# Patient Record
Sex: Male | Born: 1940 | Race: White | Hispanic: No | Smoking: Former smoker
Health system: Southern US, Community
[De-identification: ages and names within clinical notes are randomized; demographics above are authoritative.]

## PROBLEM LIST (undated history)

## (undated) DIAGNOSIS — I255 Ischemic cardiomyopathy: Secondary | ICD-10-CM

## (undated) DIAGNOSIS — A481 Legionnaires' disease: Secondary | ICD-10-CM

## (undated) DIAGNOSIS — I10 Essential (primary) hypertension: Secondary | ICD-10-CM

## (undated) DIAGNOSIS — I480 Paroxysmal atrial fibrillation: Secondary | ICD-10-CM

## (undated) DIAGNOSIS — I2510 Atherosclerotic heart disease of native coronary artery without angina pectoris: Secondary | ICD-10-CM

## (undated) DIAGNOSIS — M329 Systemic lupus erythematosus, unspecified: Secondary | ICD-10-CM

## (undated) DIAGNOSIS — E785 Hyperlipidemia, unspecified: Secondary | ICD-10-CM

## (undated) DIAGNOSIS — N200 Calculus of kidney: Secondary | ICD-10-CM

## (undated) DIAGNOSIS — I252 Old myocardial infarction: Secondary | ICD-10-CM

## (undated) DIAGNOSIS — R0602 Shortness of breath: Secondary | ICD-10-CM

## (undated) HISTORY — DX: Calculus of kidney: N20.0

## (undated) HISTORY — DX: Old myocardial infarction: I25.2

## (undated) HISTORY — DX: Systemic lupus erythematosus, unspecified: M32.9

## (undated) HISTORY — DX: Paroxysmal atrial fibrillation: I48.0

## (undated) HISTORY — DX: Atherosclerotic heart disease of native coronary artery without angina pectoris: I25.10

## (undated) HISTORY — PX: CATARACT EXTRACTION: SUR2

## (undated) HISTORY — DX: Legionnaires' disease: A48.1

## (undated) HISTORY — DX: Ischemic cardiomyopathy: I25.5

## (undated) HISTORY — DX: Hyperlipidemia, unspecified: E78.5

## (undated) HISTORY — DX: Essential (primary) hypertension: I10

## (undated) HISTORY — DX: Shortness of breath: R06.02

---

## 1987-09-19 HISTORY — PX: THORACOTOMY: SUR1349

## 1990-09-18 HISTORY — PX: INGUINAL HERNIA REPAIR: SUR1180

## 1997-09-18 HISTORY — PX: TOTAL HIP ARTHROPLASTY: SHX124

## 2006-09-18 HISTORY — PX: OTHER SURGICAL HISTORY: SHX169

## 2008-09-18 HISTORY — PX: CORONARY ARTERY BYPASS GRAFT: SHX141

## 2014-09-25 DIAGNOSIS — I252 Old myocardial infarction: Secondary | ICD-10-CM | POA: Diagnosis not present

## 2014-09-25 DIAGNOSIS — Z87898 Personal history of other specified conditions: Secondary | ICD-10-CM | POA: Diagnosis not present

## 2014-09-25 DIAGNOSIS — R079 Chest pain, unspecified: Secondary | ICD-10-CM | POA: Diagnosis not present

## 2014-09-25 DIAGNOSIS — Z7901 Long term (current) use of anticoagulants: Secondary | ICD-10-CM | POA: Diagnosis not present

## 2014-09-25 DIAGNOSIS — Z79899 Other long term (current) drug therapy: Secondary | ICD-10-CM | POA: Diagnosis not present

## 2014-09-25 DIAGNOSIS — R531 Weakness: Secondary | ICD-10-CM | POA: Diagnosis not present

## 2014-09-29 DIAGNOSIS — J449 Chronic obstructive pulmonary disease, unspecified: Secondary | ICD-10-CM | POA: Diagnosis not present

## 2014-10-26 DIAGNOSIS — J439 Emphysema, unspecified: Secondary | ICD-10-CM | POA: Diagnosis present

## 2014-10-26 DIAGNOSIS — R531 Weakness: Secondary | ICD-10-CM | POA: Diagnosis not present

## 2014-10-26 DIAGNOSIS — E860 Dehydration: Secondary | ICD-10-CM | POA: Diagnosis not present

## 2014-10-26 DIAGNOSIS — M6281 Muscle weakness (generalized): Secondary | ICD-10-CM | POA: Diagnosis not present

## 2014-10-26 DIAGNOSIS — R634 Abnormal weight loss: Secondary | ICD-10-CM | POA: Diagnosis present

## 2014-10-26 DIAGNOSIS — R109 Unspecified abdominal pain: Secondary | ICD-10-CM | POA: Diagnosis not present

## 2014-10-26 DIAGNOSIS — K8020 Calculus of gallbladder without cholecystitis without obstruction: Secondary | ICD-10-CM | POA: Diagnosis present

## 2014-10-26 DIAGNOSIS — A419 Sepsis, unspecified organism: Secondary | ICD-10-CM | POA: Diagnosis not present

## 2014-10-26 DIAGNOSIS — E785 Hyperlipidemia, unspecified: Secondary | ICD-10-CM | POA: Diagnosis not present

## 2014-10-26 DIAGNOSIS — E875 Hyperkalemia: Secondary | ICD-10-CM | POA: Diagnosis not present

## 2014-10-26 DIAGNOSIS — R0902 Hypoxemia: Secondary | ICD-10-CM | POA: Diagnosis not present

## 2014-10-26 DIAGNOSIS — Z882 Allergy status to sulfonamides status: Secondary | ICD-10-CM | POA: Diagnosis not present

## 2014-10-26 DIAGNOSIS — R161 Splenomegaly, not elsewhere classified: Secondary | ICD-10-CM | POA: Diagnosis present

## 2014-10-26 DIAGNOSIS — I499 Cardiac arrhythmia, unspecified: Secondary | ICD-10-CM | POA: Diagnosis not present

## 2014-10-26 DIAGNOSIS — R55 Syncope and collapse: Secondary | ICD-10-CM | POA: Diagnosis not present

## 2014-10-26 DIAGNOSIS — Z888 Allergy status to other drugs, medicaments and biological substances status: Secondary | ICD-10-CM | POA: Diagnosis not present

## 2014-10-26 DIAGNOSIS — Z87891 Personal history of nicotine dependence: Secondary | ICD-10-CM | POA: Diagnosis not present

## 2014-10-26 DIAGNOSIS — D6862 Lupus anticoagulant syndrome: Secondary | ICD-10-CM | POA: Diagnosis not present

## 2014-10-26 DIAGNOSIS — M329 Systemic lupus erythematosus, unspecified: Secondary | ICD-10-CM | POA: Diagnosis not present

## 2014-10-26 DIAGNOSIS — J150 Pneumonia due to Klebsiella pneumoniae: Secondary | ICD-10-CM | POA: Diagnosis not present

## 2014-10-26 DIAGNOSIS — Z951 Presence of aortocoronary bypass graft: Secondary | ICD-10-CM | POA: Diagnosis not present

## 2014-10-26 DIAGNOSIS — J189 Pneumonia, unspecified organism: Secondary | ICD-10-CM | POA: Diagnosis not present

## 2014-10-26 DIAGNOSIS — Z885 Allergy status to narcotic agent status: Secondary | ICD-10-CM | POA: Diagnosis not present

## 2014-10-26 DIAGNOSIS — Z79899 Other long term (current) drug therapy: Secondary | ICD-10-CM | POA: Diagnosis not present

## 2014-10-26 DIAGNOSIS — I252 Old myocardial infarction: Secondary | ICD-10-CM | POA: Diagnosis not present

## 2014-10-26 DIAGNOSIS — Z9989 Dependence on other enabling machines and devices: Secondary | ICD-10-CM | POA: Diagnosis not present

## 2014-10-26 DIAGNOSIS — M791 Myalgia: Secondary | ICD-10-CM | POA: Diagnosis not present

## 2014-10-26 DIAGNOSIS — R079 Chest pain, unspecified: Secondary | ICD-10-CM | POA: Diagnosis not present

## 2014-10-26 DIAGNOSIS — N200 Calculus of kidney: Secondary | ICD-10-CM | POA: Diagnosis present

## 2014-10-26 DIAGNOSIS — J449 Chronic obstructive pulmonary disease, unspecified: Secondary | ICD-10-CM | POA: Diagnosis not present

## 2014-10-26 DIAGNOSIS — R1032 Left lower quadrant pain: Secondary | ICD-10-CM | POA: Diagnosis not present

## 2014-10-26 DIAGNOSIS — A4189 Other specified sepsis: Secondary | ICD-10-CM | POA: Diagnosis not present

## 2014-10-26 DIAGNOSIS — Z880 Allergy status to penicillin: Secondary | ICD-10-CM | POA: Diagnosis not present

## 2014-10-26 DIAGNOSIS — Z6822 Body mass index (BMI) 22.0-22.9, adult: Secondary | ICD-10-CM | POA: Diagnosis not present

## 2014-11-01 DIAGNOSIS — R55 Syncope and collapse: Secondary | ICD-10-CM | POA: Diagnosis not present

## 2014-11-01 DIAGNOSIS — J449 Chronic obstructive pulmonary disease, unspecified: Secondary | ICD-10-CM | POA: Diagnosis not present

## 2014-11-01 DIAGNOSIS — E039 Hypothyroidism, unspecified: Secondary | ICD-10-CM | POA: Diagnosis not present

## 2014-11-01 DIAGNOSIS — F068 Other specified mental disorders due to known physiological condition: Secondary | ICD-10-CM | POA: Diagnosis not present

## 2014-11-01 DIAGNOSIS — E780 Pure hypercholesterolemia: Secondary | ICD-10-CM | POA: Diagnosis not present

## 2014-11-01 DIAGNOSIS — J439 Emphysema, unspecified: Secondary | ICD-10-CM | POA: Diagnosis not present

## 2014-11-01 DIAGNOSIS — M329 Systemic lupus erythematosus, unspecified: Secondary | ICD-10-CM | POA: Diagnosis not present

## 2014-11-01 DIAGNOSIS — J810 Acute pulmonary edema: Secondary | ICD-10-CM | POA: Diagnosis not present

## 2014-11-01 DIAGNOSIS — M791 Myalgia: Secondary | ICD-10-CM | POA: Diagnosis not present

## 2014-11-01 DIAGNOSIS — E785 Hyperlipidemia, unspecified: Secondary | ICD-10-CM | POA: Diagnosis not present

## 2014-11-01 DIAGNOSIS — I252 Old myocardial infarction: Secondary | ICD-10-CM | POA: Diagnosis not present

## 2014-11-01 DIAGNOSIS — I499 Cardiac arrhythmia, unspecified: Secondary | ICD-10-CM | POA: Diagnosis not present

## 2014-11-01 DIAGNOSIS — E875 Hyperkalemia: Secondary | ICD-10-CM | POA: Diagnosis not present

## 2014-11-01 DIAGNOSIS — I10 Essential (primary) hypertension: Secondary | ICD-10-CM | POA: Diagnosis not present

## 2014-11-01 DIAGNOSIS — Z951 Presence of aortocoronary bypass graft: Secondary | ICD-10-CM | POA: Diagnosis not present

## 2014-11-01 DIAGNOSIS — M6281 Muscle weakness (generalized): Secondary | ICD-10-CM | POA: Diagnosis not present

## 2014-11-03 DIAGNOSIS — J449 Chronic obstructive pulmonary disease, unspecified: Secondary | ICD-10-CM | POA: Diagnosis not present

## 2014-11-03 DIAGNOSIS — I10 Essential (primary) hypertension: Secondary | ICD-10-CM | POA: Diagnosis not present

## 2014-11-09 DIAGNOSIS — I10 Essential (primary) hypertension: Secondary | ICD-10-CM | POA: Diagnosis not present

## 2014-11-09 DIAGNOSIS — F068 Other specified mental disorders due to known physiological condition: Secondary | ICD-10-CM | POA: Diagnosis not present

## 2014-11-09 DIAGNOSIS — E780 Pure hypercholesterolemia: Secondary | ICD-10-CM | POA: Diagnosis not present

## 2014-11-09 DIAGNOSIS — E039 Hypothyroidism, unspecified: Secondary | ICD-10-CM | POA: Diagnosis not present

## 2014-11-24 DIAGNOSIS — J449 Chronic obstructive pulmonary disease, unspecified: Secondary | ICD-10-CM | POA: Diagnosis not present

## 2014-11-24 DIAGNOSIS — I10 Essential (primary) hypertension: Secondary | ICD-10-CM | POA: Diagnosis not present

## 2014-12-10 DIAGNOSIS — R194 Change in bowel habit: Secondary | ICD-10-CM | POA: Diagnosis not present

## 2014-12-10 DIAGNOSIS — Z800 Family history of malignant neoplasm of digestive organs: Secondary | ICD-10-CM | POA: Diagnosis not present

## 2014-12-10 DIAGNOSIS — I4891 Unspecified atrial fibrillation: Secondary | ICD-10-CM | POA: Diagnosis not present

## 2014-12-10 DIAGNOSIS — I10 Essential (primary) hypertension: Secondary | ICD-10-CM | POA: Diagnosis not present

## 2014-12-10 DIAGNOSIS — R197 Diarrhea, unspecified: Secondary | ICD-10-CM | POA: Diagnosis not present

## 2014-12-10 DIAGNOSIS — Z1211 Encounter for screening for malignant neoplasm of colon: Secondary | ICD-10-CM | POA: Diagnosis not present

## 2014-12-10 DIAGNOSIS — Z1212 Encounter for screening for malignant neoplasm of rectum: Secondary | ICD-10-CM | POA: Diagnosis not present

## 2015-01-08 DIAGNOSIS — Z808 Family history of malignant neoplasm of other organs or systems: Secondary | ICD-10-CM | POA: Diagnosis not present

## 2015-01-08 DIAGNOSIS — K5730 Diverticulosis of large intestine without perforation or abscess without bleeding: Secondary | ICD-10-CM | POA: Diagnosis not present

## 2015-01-08 DIAGNOSIS — Z800 Family history of malignant neoplasm of digestive organs: Secondary | ICD-10-CM | POA: Diagnosis not present

## 2015-01-08 DIAGNOSIS — K529 Noninfective gastroenteritis and colitis, unspecified: Secondary | ICD-10-CM | POA: Diagnosis not present

## 2015-01-08 DIAGNOSIS — R194 Change in bowel habit: Secondary | ICD-10-CM | POA: Diagnosis not present

## 2015-01-08 DIAGNOSIS — I4891 Unspecified atrial fibrillation: Secondary | ICD-10-CM | POA: Diagnosis not present

## 2015-01-08 DIAGNOSIS — K598 Other specified functional intestinal disorders: Secondary | ICD-10-CM | POA: Diagnosis not present

## 2015-01-08 DIAGNOSIS — Z1212 Encounter for screening for malignant neoplasm of rectum: Secondary | ICD-10-CM | POA: Diagnosis not present

## 2015-01-08 DIAGNOSIS — I10 Essential (primary) hypertension: Secondary | ICD-10-CM | POA: Diagnosis not present

## 2015-01-08 DIAGNOSIS — Z1211 Encounter for screening for malignant neoplasm of colon: Secondary | ICD-10-CM | POA: Diagnosis not present

## 2015-01-08 DIAGNOSIS — R197 Diarrhea, unspecified: Secondary | ICD-10-CM | POA: Diagnosis not present

## 2015-01-15 DIAGNOSIS — M1612 Unilateral primary osteoarthritis, left hip: Secondary | ICD-10-CM | POA: Diagnosis not present

## 2015-01-15 DIAGNOSIS — Z96641 Presence of right artificial hip joint: Secondary | ICD-10-CM | POA: Diagnosis not present

## 2015-01-15 DIAGNOSIS — M1611 Unilateral primary osteoarthritis, right hip: Secondary | ICD-10-CM | POA: Diagnosis not present

## 2015-01-15 DIAGNOSIS — Z96642 Presence of left artificial hip joint: Secondary | ICD-10-CM | POA: Diagnosis not present

## 2015-01-18 DIAGNOSIS — Z800 Family history of malignant neoplasm of digestive organs: Secondary | ICD-10-CM | POA: Diagnosis not present

## 2015-01-18 DIAGNOSIS — R197 Diarrhea, unspecified: Secondary | ICD-10-CM | POA: Diagnosis not present

## 2015-01-18 DIAGNOSIS — K5730 Diverticulosis of large intestine without perforation or abscess without bleeding: Secondary | ICD-10-CM | POA: Diagnosis not present

## 2015-01-18 DIAGNOSIS — R634 Abnormal weight loss: Secondary | ICD-10-CM | POA: Diagnosis not present

## 2015-01-19 DIAGNOSIS — R634 Abnormal weight loss: Secondary | ICD-10-CM | POA: Diagnosis not present

## 2015-01-19 DIAGNOSIS — R197 Diarrhea, unspecified: Secondary | ICD-10-CM | POA: Diagnosis not present

## 2015-02-23 DIAGNOSIS — J449 Chronic obstructive pulmonary disease, unspecified: Secondary | ICD-10-CM | POA: Diagnosis not present

## 2015-02-23 DIAGNOSIS — I10 Essential (primary) hypertension: Secondary | ICD-10-CM | POA: Diagnosis not present

## 2015-03-10 DIAGNOSIS — D485 Neoplasm of uncertain behavior of skin: Secondary | ICD-10-CM | POA: Diagnosis not present

## 2015-03-10 DIAGNOSIS — C44320 Squamous cell carcinoma of skin of unspecified parts of face: Secondary | ICD-10-CM | POA: Diagnosis not present

## 2015-03-10 DIAGNOSIS — C44329 Squamous cell carcinoma of skin of other parts of face: Secondary | ICD-10-CM | POA: Diagnosis not present

## 2015-04-01 DIAGNOSIS — I480 Paroxysmal atrial fibrillation: Secondary | ICD-10-CM | POA: Diagnosis not present

## 2015-04-01 DIAGNOSIS — I10 Essential (primary) hypertension: Secondary | ICD-10-CM | POA: Diagnosis not present

## 2015-04-01 DIAGNOSIS — I2510 Atherosclerotic heart disease of native coronary artery without angina pectoris: Secondary | ICD-10-CM | POA: Diagnosis not present

## 2015-04-01 DIAGNOSIS — E780 Pure hypercholesterolemia: Secondary | ICD-10-CM | POA: Diagnosis not present

## 2015-05-12 DIAGNOSIS — R0602 Shortness of breath: Secondary | ICD-10-CM | POA: Diagnosis not present

## 2015-05-12 DIAGNOSIS — Z79899 Other long term (current) drug therapy: Secondary | ICD-10-CM | POA: Diagnosis not present

## 2015-06-11 DIAGNOSIS — I10 Essential (primary) hypertension: Secondary | ICD-10-CM | POA: Diagnosis not present

## 2015-06-11 DIAGNOSIS — E785 Hyperlipidemia, unspecified: Secondary | ICD-10-CM | POA: Diagnosis not present

## 2015-06-22 DIAGNOSIS — G603 Idiopathic progressive neuropathy: Secondary | ICD-10-CM | POA: Diagnosis not present

## 2015-06-22 DIAGNOSIS — I10 Essential (primary) hypertension: Secondary | ICD-10-CM | POA: Diagnosis not present

## 2015-06-22 DIAGNOSIS — J449 Chronic obstructive pulmonary disease, unspecified: Secondary | ICD-10-CM | POA: Diagnosis not present

## 2015-08-17 DIAGNOSIS — Z85828 Personal history of other malignant neoplasm of skin: Secondary | ICD-10-CM | POA: Diagnosis not present

## 2015-08-17 DIAGNOSIS — L820 Inflamed seborrheic keratosis: Secondary | ICD-10-CM | POA: Diagnosis not present

## 2015-08-19 DIAGNOSIS — R3989 Other symptoms and signs involving the genitourinary system: Secondary | ICD-10-CM | POA: Diagnosis not present

## 2015-08-19 DIAGNOSIS — Z87442 Personal history of urinary calculi: Secondary | ICD-10-CM | POA: Diagnosis not present

## 2015-08-31 DIAGNOSIS — I480 Paroxysmal atrial fibrillation: Secondary | ICD-10-CM | POA: Diagnosis not present

## 2015-08-31 DIAGNOSIS — J449 Chronic obstructive pulmonary disease, unspecified: Secondary | ICD-10-CM | POA: Diagnosis not present

## 2015-08-31 DIAGNOSIS — I5042 Chronic combined systolic (congestive) and diastolic (congestive) heart failure: Secondary | ICD-10-CM | POA: Diagnosis not present

## 2015-09-01 DIAGNOSIS — I509 Heart failure, unspecified: Secondary | ICD-10-CM | POA: Diagnosis not present

## 2015-09-01 DIAGNOSIS — I10 Essential (primary) hypertension: Secondary | ICD-10-CM | POA: Diagnosis not present

## 2015-09-01 DIAGNOSIS — R3989 Other symptoms and signs involving the genitourinary system: Secondary | ICD-10-CM | POA: Diagnosis not present

## 2015-09-01 DIAGNOSIS — E784 Other hyperlipidemia: Secondary | ICD-10-CM | POA: Diagnosis not present

## 2015-09-19 HISTORY — PX: COLONOSCOPY: SHX174

## 2015-10-06 DIAGNOSIS — N400 Enlarged prostate without lower urinary tract symptoms: Secondary | ICD-10-CM | POA: Diagnosis not present

## 2015-10-06 DIAGNOSIS — R9349 Abnormal radiologic findings on diagnostic imaging of other urinary organs: Secondary | ICD-10-CM | POA: Diagnosis not present

## 2015-10-25 DIAGNOSIS — I2510 Atherosclerotic heart disease of native coronary artery without angina pectoris: Secondary | ICD-10-CM | POA: Diagnosis not present

## 2015-10-25 DIAGNOSIS — Z79899 Other long term (current) drug therapy: Secondary | ICD-10-CM | POA: Diagnosis not present

## 2015-10-25 DIAGNOSIS — I10 Essential (primary) hypertension: Secondary | ICD-10-CM | POA: Diagnosis not present

## 2015-10-25 DIAGNOSIS — Z96641 Presence of right artificial hip joint: Secondary | ICD-10-CM | POA: Diagnosis not present

## 2015-10-25 DIAGNOSIS — S7001XA Contusion of right hip, initial encounter: Secondary | ICD-10-CM | POA: Diagnosis not present

## 2015-10-25 DIAGNOSIS — Z87891 Personal history of nicotine dependence: Secondary | ICD-10-CM | POA: Diagnosis not present

## 2015-10-25 DIAGNOSIS — I4891 Unspecified atrial fibrillation: Secondary | ICD-10-CM | POA: Diagnosis not present

## 2015-10-25 DIAGNOSIS — Z7901 Long term (current) use of anticoagulants: Secondary | ICD-10-CM | POA: Diagnosis not present

## 2015-10-25 DIAGNOSIS — R278 Other lack of coordination: Secondary | ICD-10-CM | POA: Diagnosis not present

## 2015-10-25 DIAGNOSIS — Z743 Need for continuous supervision: Secondary | ICD-10-CM | POA: Diagnosis not present

## 2015-10-25 DIAGNOSIS — W19XXXA Unspecified fall, initial encounter: Secondary | ICD-10-CM | POA: Diagnosis not present

## 2015-10-25 DIAGNOSIS — E7800 Pure hypercholesterolemia, unspecified: Secondary | ICD-10-CM | POA: Diagnosis not present

## 2015-10-25 DIAGNOSIS — M79604 Pain in right leg: Secondary | ICD-10-CM | POA: Diagnosis not present

## 2015-11-15 DIAGNOSIS — M1611 Unilateral primary osteoarthritis, right hip: Secondary | ICD-10-CM | POA: Diagnosis not present

## 2015-11-15 DIAGNOSIS — Z96642 Presence of left artificial hip joint: Secondary | ICD-10-CM | POA: Diagnosis not present

## 2015-11-15 DIAGNOSIS — M1612 Unilateral primary osteoarthritis, left hip: Secondary | ICD-10-CM | POA: Diagnosis not present

## 2015-11-15 DIAGNOSIS — Z96641 Presence of right artificial hip joint: Secondary | ICD-10-CM | POA: Diagnosis not present

## 2015-12-03 DIAGNOSIS — E7800 Pure hypercholesterolemia, unspecified: Secondary | ICD-10-CM | POA: Diagnosis not present

## 2015-12-03 DIAGNOSIS — R9431 Abnormal electrocardiogram [ECG] [EKG]: Secondary | ICD-10-CM | POA: Diagnosis not present

## 2015-12-03 DIAGNOSIS — I2510 Atherosclerotic heart disease of native coronary artery without angina pectoris: Secondary | ICD-10-CM | POA: Diagnosis not present

## 2015-12-03 DIAGNOSIS — I480 Paroxysmal atrial fibrillation: Secondary | ICD-10-CM | POA: Diagnosis not present

## 2015-12-03 DIAGNOSIS — I10 Essential (primary) hypertension: Secondary | ICD-10-CM | POA: Diagnosis not present

## 2016-01-03 DIAGNOSIS — E785 Hyperlipidemia, unspecified: Secondary | ICD-10-CM | POA: Diagnosis not present

## 2016-01-03 DIAGNOSIS — I10 Essential (primary) hypertension: Secondary | ICD-10-CM | POA: Diagnosis not present

## 2016-01-03 DIAGNOSIS — N400 Enlarged prostate without lower urinary tract symptoms: Secondary | ICD-10-CM | POA: Diagnosis not present

## 2016-01-04 DIAGNOSIS — I480 Paroxysmal atrial fibrillation: Secondary | ICD-10-CM | POA: Diagnosis not present

## 2016-01-04 DIAGNOSIS — I10 Essential (primary) hypertension: Secondary | ICD-10-CM | POA: Diagnosis not present

## 2016-01-04 DIAGNOSIS — J449 Chronic obstructive pulmonary disease, unspecified: Secondary | ICD-10-CM | POA: Diagnosis not present

## 2016-01-04 DIAGNOSIS — M159 Polyosteoarthritis, unspecified: Secondary | ICD-10-CM | POA: Diagnosis not present

## 2016-01-05 DIAGNOSIS — M2550 Pain in unspecified joint: Secondary | ICD-10-CM | POA: Diagnosis not present

## 2016-01-05 DIAGNOSIS — R140 Abdominal distension (gaseous): Secondary | ICD-10-CM | POA: Diagnosis not present

## 2016-01-10 DIAGNOSIS — E7800 Pure hypercholesterolemia, unspecified: Secondary | ICD-10-CM | POA: Diagnosis not present

## 2016-01-10 DIAGNOSIS — Z7952 Long term (current) use of systemic steroids: Secondary | ICD-10-CM | POA: Diagnosis not present

## 2016-01-10 DIAGNOSIS — I4891 Unspecified atrial fibrillation: Secondary | ICD-10-CM | POA: Diagnosis not present

## 2016-01-10 DIAGNOSIS — I10 Essential (primary) hypertension: Secondary | ICD-10-CM | POA: Diagnosis not present

## 2016-01-10 DIAGNOSIS — R109 Unspecified abdominal pain: Secondary | ICD-10-CM | POA: Diagnosis not present

## 2016-01-10 DIAGNOSIS — K922 Gastrointestinal hemorrhage, unspecified: Secondary | ICD-10-CM | POA: Diagnosis not present

## 2016-01-10 DIAGNOSIS — Z7901 Long term (current) use of anticoagulants: Secondary | ICD-10-CM | POA: Diagnosis not present

## 2016-01-10 DIAGNOSIS — Z87891 Personal history of nicotine dependence: Secondary | ICD-10-CM | POA: Diagnosis not present

## 2016-01-10 DIAGNOSIS — Z79899 Other long term (current) drug therapy: Secondary | ICD-10-CM | POA: Diagnosis not present

## 2016-01-10 DIAGNOSIS — G629 Polyneuropathy, unspecified: Secondary | ICD-10-CM | POA: Diagnosis not present

## 2016-01-11 DIAGNOSIS — J449 Chronic obstructive pulmonary disease, unspecified: Secondary | ICD-10-CM | POA: Diagnosis not present

## 2016-01-11 DIAGNOSIS — I10 Essential (primary) hypertension: Secondary | ICD-10-CM | POA: Diagnosis not present

## 2016-01-11 DIAGNOSIS — M159 Polyosteoarthritis, unspecified: Secondary | ICD-10-CM | POA: Diagnosis not present

## 2016-01-19 DIAGNOSIS — R140 Abdominal distension (gaseous): Secondary | ICD-10-CM | POA: Diagnosis not present

## 2016-01-19 DIAGNOSIS — K921 Melena: Secondary | ICD-10-CM | POA: Diagnosis not present

## 2016-01-19 DIAGNOSIS — R1012 Left upper quadrant pain: Secondary | ICD-10-CM | POA: Diagnosis not present

## 2016-01-19 DIAGNOSIS — R635 Abnormal weight gain: Secondary | ICD-10-CM | POA: Diagnosis not present

## 2016-02-21 DIAGNOSIS — I2510 Atherosclerotic heart disease of native coronary artery without angina pectoris: Secondary | ICD-10-CM | POA: Diagnosis not present

## 2016-02-21 DIAGNOSIS — Z951 Presence of aortocoronary bypass graft: Secondary | ICD-10-CM | POA: Diagnosis not present

## 2016-02-21 DIAGNOSIS — E7800 Pure hypercholesterolemia, unspecified: Secondary | ICD-10-CM | POA: Diagnosis not present

## 2016-02-21 DIAGNOSIS — I10 Essential (primary) hypertension: Secondary | ICD-10-CM | POA: Diagnosis not present

## 2016-02-21 DIAGNOSIS — I480 Paroxysmal atrial fibrillation: Secondary | ICD-10-CM | POA: Diagnosis not present

## 2016-05-16 DIAGNOSIS — R718 Other abnormality of red blood cells: Secondary | ICD-10-CM | POA: Diagnosis not present

## 2016-05-16 DIAGNOSIS — N200 Calculus of kidney: Secondary | ICD-10-CM | POA: Diagnosis not present

## 2016-05-16 DIAGNOSIS — N289 Disorder of kidney and ureter, unspecified: Secondary | ICD-10-CM | POA: Diagnosis not present

## 2016-05-16 DIAGNOSIS — Z7689 Persons encountering health services in other specified circumstances: Secondary | ICD-10-CM | POA: Diagnosis not present

## 2016-05-16 DIAGNOSIS — I480 Paroxysmal atrial fibrillation: Secondary | ICD-10-CM | POA: Diagnosis not present

## 2016-05-16 DIAGNOSIS — E785 Hyperlipidemia, unspecified: Secondary | ICD-10-CM | POA: Diagnosis not present

## 2016-05-16 DIAGNOSIS — L930 Discoid lupus erythematosus: Secondary | ICD-10-CM | POA: Diagnosis not present

## 2016-05-16 DIAGNOSIS — I10 Essential (primary) hypertension: Secondary | ICD-10-CM | POA: Diagnosis not present

## 2016-05-18 DIAGNOSIS — I6523 Occlusion and stenosis of bilateral carotid arteries: Secondary | ICD-10-CM | POA: Diagnosis not present

## 2016-05-18 DIAGNOSIS — I10 Essential (primary) hypertension: Secondary | ICD-10-CM | POA: Diagnosis not present

## 2016-05-18 DIAGNOSIS — E782 Mixed hyperlipidemia: Secondary | ICD-10-CM | POA: Diagnosis not present

## 2016-05-18 DIAGNOSIS — Z951 Presence of aortocoronary bypass graft: Secondary | ICD-10-CM | POA: Diagnosis not present

## 2016-05-18 DIAGNOSIS — A481 Legionnaires' disease: Secondary | ICD-10-CM | POA: Diagnosis not present

## 2016-05-18 DIAGNOSIS — I480 Paroxysmal atrial fibrillation: Secondary | ICD-10-CM | POA: Diagnosis not present

## 2016-05-18 DIAGNOSIS — I2510 Atherosclerotic heart disease of native coronary artery without angina pectoris: Secondary | ICD-10-CM | POA: Diagnosis not present

## 2016-05-18 DIAGNOSIS — N183 Chronic kidney disease, stage 3 (moderate): Secondary | ICD-10-CM | POA: Diagnosis not present

## 2016-05-18 DIAGNOSIS — I272 Other secondary pulmonary hypertension: Secondary | ICD-10-CM | POA: Diagnosis not present

## 2016-12-28 DIAGNOSIS — M5440 Lumbago with sciatica, unspecified side: Secondary | ICD-10-CM | POA: Diagnosis not present

## 2016-12-28 DIAGNOSIS — K5900 Constipation, unspecified: Secondary | ICD-10-CM | POA: Diagnosis not present

## 2017-01-17 DIAGNOSIS — Z87442 Personal history of urinary calculi: Secondary | ICD-10-CM | POA: Diagnosis not present

## 2017-01-17 DIAGNOSIS — I25810 Atherosclerosis of coronary artery bypass graft(s) without angina pectoris: Secondary | ICD-10-CM | POA: Diagnosis not present

## 2017-01-17 DIAGNOSIS — M328 Other forms of systemic lupus erythematosus: Secondary | ICD-10-CM | POA: Diagnosis not present

## 2017-01-17 DIAGNOSIS — Z79899 Other long term (current) drug therapy: Secondary | ICD-10-CM | POA: Diagnosis not present

## 2017-01-17 DIAGNOSIS — E7800 Pure hypercholesterolemia, unspecified: Secondary | ICD-10-CM | POA: Diagnosis not present

## 2017-01-17 DIAGNOSIS — I10 Essential (primary) hypertension: Secondary | ICD-10-CM | POA: Diagnosis not present

## 2017-02-16 ENCOUNTER — Ambulatory Visit (INDEPENDENT_AMBULATORY_CARE_PROVIDER_SITE_OTHER): Payer: Medicare Other | Admitting: Physician Assistant

## 2017-02-16 ENCOUNTER — Encounter: Payer: Self-pay | Admitting: Physician Assistant

## 2017-02-16 ENCOUNTER — Telehealth: Payer: Self-pay | Admitting: Physician Assistant

## 2017-02-16 VITALS — BP 118/74 | HR 55 | Ht 69.0 in | Wt 158.4 lb

## 2017-02-16 DIAGNOSIS — I4510 Unspecified right bundle-branch block: Secondary | ICD-10-CM | POA: Diagnosis not present

## 2017-02-16 DIAGNOSIS — I2510 Atherosclerotic heart disease of native coronary artery without angina pectoris: Secondary | ICD-10-CM | POA: Diagnosis not present

## 2017-02-16 DIAGNOSIS — E785 Hyperlipidemia, unspecified: Secondary | ICD-10-CM | POA: Diagnosis not present

## 2017-02-16 DIAGNOSIS — M329 Systemic lupus erythematosus, unspecified: Secondary | ICD-10-CM | POA: Insufficient documentation

## 2017-02-16 DIAGNOSIS — I10 Essential (primary) hypertension: Secondary | ICD-10-CM | POA: Insufficient documentation

## 2017-02-16 DIAGNOSIS — I480 Paroxysmal atrial fibrillation: Secondary | ICD-10-CM | POA: Insufficient documentation

## 2017-02-16 NOTE — Telephone Encounter (Signed)
ROI faxed to [facility].

## 2017-02-16 NOTE — Progress Notes (Signed)
Cardiology Office Note:    Date:  02/16/2017   ID:  Johnny Morgan, DOB [birth year], MRN [id number]  PCP:  Lujean Amel, MD  Cardiologist:  New - Dr. Dorris Carnes / Richardson Dopp, PA-C    Referring MD: Lujean Amel, MD   Chief Complaint  Patient presents with  . New Patient    Hx of CAD s/p CABG   History of Present Illness:    Johnny Morgan is a 76 y.o. male with a hx of CAD s/p CABG in 2010, HTN, HL, Lupus, PAF (post op AFib - took amiodarone but stopped due to dizziness) who is being seen today for the evaluation of CAD at the request of Koirala, Dibas, MD.   Mr. Johnny Morgan moved to [place] from the [place], [place] area in September 2017.  He moved here to be closer to his girlfriend's family.  He tells me that he had a "small heart attack" prior to his CABG.  He thinks his last stress test was in 2015.  His anginal equivalent was back pain.  He denies any symptoms of angina.  He gets short of breath from time to time and thinks it may be due to the humidity.  He denies chest pain, syncope, orthopnea, PND, edema.  He does have issues with his balance.  He denies any falls. He denies claudication symptoms.    PAD Screen 02/16/2017  Previous PAD dx? No  Previous surgical procedure? No  Pain with walking? No  Feet/toe relief with dangling? No  Painful, non-healing ulcers? No  Extremities discolored? Yes    Prior CV studies:   The following studies were reviewed today:  None -records requested from his prior Cardiologist: Dr. Judeth Porch in [place]  Past Medical History:  Diagnosis Date  . CAD (coronary artery disease)    s/p CABG in 2010  . Essential hypertension   . HLD (hyperlipidemia)   . 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 Nephrolithiasis   . PAF (paroxysmal atrial fibrillation) ([place])   . SLE (systemic lupus erythematosus) ([place])    prior hx of long term prednisone use    Past Surgical History:  Procedure Laterality Date  . CATARACT EXTRACTION    .  COLONOSCOPY  2017  . CORONARY ARTERY BYPASS GRAFT  2010  . INGUINAL HERNIA REPAIR Bilateral 1992  . Partial Shoulder Replacement Right 2008  . THORACOTOMY  1989   Dx with Legionaires  . TOTAL HIP ARTHROPLASTY Right 1999    Current Medications: Current Meds  Medication Sig  . AMLODIPINE BESYLATE PO Take 5 mg by mouth daily.  Marland Kitchen aspirin EC 81 MG tablet Take 81 mg by mouth daily.  Marland Kitchen atorvastatin (LIPITOR) 40 MG tablet Take 40 mg by mouth daily.  Marland Kitchen METOPROLOL SUCCINATE ER PO Take 25 mg by mouth daily.     Allergies:   Penicillins; Sulfa antibiotics; and Tamiflu [oseltamivir phosphate]   Social History   Social History  . Marital status: Divorced    Spouse name: N/A  . Number of children: N/A  . Years of education: N/A   Occupational History  . Retired     Prior [place] History Main Topics  . Smoking status: Former Smoker    Quit date: 1990  . Smokeless tobacco: Never Used  . Alcohol use No  . Drug use: No  . Sexual activity: Not Asked   Other Topics Concern  . None   Social History Narrative   Moved  to [place] from [place] area in 05/2016   Divorced   3 children     Family Hx: The patient's family history includes Heart disease in his brother, sister, and sister.  ROS:   Please see the history of present illness.    Review of Systems  HENT: Positive for hearing loss.   Cardiovascular: Positive for dyspnea on exertion.  Respiratory: Positive for shortness of breath.   Genitourinary: Positive for incomplete emptying.   All other systems reviewed and are negative.   EKGs/Labs/Other Test Reviewed:    EKG:  EKG is  ordered today.  The ekg ordered today demonstrates sinus brady, HR 55, RBBB, QTc 470 ms, no old tracing to compare with   Recent Labs: No results found for requested labs within last 8760 hours.  Labs from PCP 01/17/17 TC 144, TG 108, HDL 62, LDL 61 Creatinine 1.22, K 4.8, ALT 23, Hgb 14.9  Recent Lipid Panel No results found for: CHOL,  TRIG, HDL, CHOLHDL, LDLCALC, LDLDIRECT  Physical Exam:    VS:  BP 118/74 (BP Location: Left Arm, Patient Position: Sitting, Cuff Size: Normal)   Pulse (!) 55   Ht 5\' 9"  (1.753 m)   Wt 158 lb 6.4 oz (71.8 kg)   BMI 23.39 kg/m     Wt Readings from Last 3 Encounters:  02/16/17 158 lb 6.4 oz (71.8 kg)     Physical Exam  Constitutional: He is oriented to person, place, and time. He appears well-developed and well-nourished. No distress.  HENT:  Head: Normocephalic and atraumatic.  Eyes: No scleral icterus.  Neck: Normal range of motion. No JVD present. Carotid bruit is not present.  Cardiovascular: Normal rate, regular rhythm, S1 normal and S2 normal.  Exam reveals no gallop.   No murmur heard. Pulses:      Dorsalis pedis pulses are 2+ on the right side, and 2+ on the left side.       Posterior tibial pulses are 2+ on the right side, and 2+ on the left side.  Pulmonary/Chest: Effort normal and breath sounds normal. He has no wheezes. He has no rhonchi. He has no rales.  Abdominal: Soft. There is no hepatosplenomegaly. There is no tenderness.  Musculoskeletal: He exhibits no edema.  Neurological: He is Morgan and oriented to person, place, and time.  Skin: Skin is warm and dry.  Psychiatric: He has a normal mood and affect.    ASSESSMENT:    1. Coronary artery disease involving native coronary artery of native heart without angina pectoris   2. Essential hypertension   3. Hyperlipidemia, unspecified hyperlipidemia type   4. RBBB (right bundle branch block)   5. Systemic lupus erythematosus, unspecified SLE type, unspecified organ involvement status ([place])    PLAN:    In order of problems listed above:  1. Coronary artery disease involving native coronary artery of native heart without angina pectoris -  S/p prior myocardial infarction and subsequent CABG in 2010.  He moved to [place] from [place] last year.  He thinks his last Nuclear stress test was in 2015.  I will request records from  his last Cardiologist.  He has been short of breath at times and does have a RBBB.  I will obtain an echocardiogram to assess his LVEF.  Continue ASA, statin, beta-blocker.  I will establish him with Dr. Harrington Challenger on my care team.   2. Essential hypertension - The patient's blood pressure is controlled on his current regimen.  Continue current therapy.  3. Hyperlipidemia, unspecified hyperlipidemia type - Managed by PCP.  LDL optimal on most recent lab work.  Continue current Rx.    4. RBBB (right bundle branch block) -  Obtain echocardiogram.  5. Systemic lupus erythematosus, unspecified SLE type, unspecified organ involvement status ([place]) - Managed by PCP.    Dispo:  Return in about 1 year (around 02/16/2018) for Routine Follow Up, w/ Dr. Harrington Challenger or Richardson Dopp, PA-C.   Medication Adjustments/Labs and Tests Ordered: Current medicines are reviewed at length with the patient today.  Concerns regarding medicines are outlined above.  Orders/Tests:  Orders Placed This Encounter  Procedures  . EKG 12-Lead  . ECHOCARDIOGRAM COMPLETE   Medication changes: No orders of the defined types were placed in this encounter.  Signed, Richardson Dopp, PA-C  02/16/2017 11:23 AM    [facility] [address], [place], [place]  [address] Phone: [phone]; Fax: [phone]

## 2017-02-16 NOTE — Patient Instructions (Signed)
Medication Instructions:  1. Your physician recommends that you continue on your current medications as directed. Please refer to the Current Medication list given to you today.   Labwork: NONE ORDERED  Testing/Procedures: 1. Your physician has requested that you have an echocardiogram. Echocardiography is a painless test that uses sound waves to create images of your heart. It provides your doctor with information about the size and shape of your heart and how well your heart's chambers and valves are working. This procedure takes approximately one hour. There are no restrictions for this procedure.    Follow-Up: Your physician wants you to follow-up in: [place] DR. ROSS You will receive a reminder letter in the mail two months in advance. If you don't receive a letter, please call our office to schedule the follow-up appointment.   Any Other Special Instructions Will Be Listed Below (If Applicable).     If you need a refill on your cardiac medications before your next appointment, please call your pharmacy.

## 2017-02-19 ENCOUNTER — Telehealth: Payer: Self-pay

## 2017-02-19 ENCOUNTER — Telehealth: Payer: Self-pay | Admitting: Physician Assistant

## 2017-02-19 NOTE — Telephone Encounter (Signed)
GAVE NOTES FROM [facility] IN [place] TO [place]

## 2017-02-19 NOTE — Telephone Encounter (Signed)
Records received from [facility] Dr. Kathi Simpers. Placed in Chart Prep.

## 2017-02-25 DIAGNOSIS — R399 Unspecified symptoms and signs involving the genitourinary system: Secondary | ICD-10-CM | POA: Diagnosis not present

## 2017-02-25 DIAGNOSIS — R1030 Lower abdominal pain, unspecified: Secondary | ICD-10-CM | POA: Diagnosis not present

## 2017-02-26 ENCOUNTER — Encounter: Payer: Self-pay | Admitting: Physician Assistant

## 2017-02-26 ENCOUNTER — Telehealth: Payer: Self-pay | Admitting: Physician Assistant

## 2017-02-26 DIAGNOSIS — I255 Ischemic cardiomyopathy: Secondary | ICD-10-CM | POA: Insufficient documentation

## 2017-02-26 DIAGNOSIS — I252 Old myocardial infarction: Secondary | ICD-10-CM | POA: Insufficient documentation

## 2017-02-26 NOTE — Telephone Encounter (Signed)
I received notes from his prior Cardiologist in [place].   He had atrial fibrillation documented on a heart monitor in the past and was previously on anticoagulation (Eliquis then Xarelto). He really should resume anticoagulation. His stroke risk is elevated (7.2% per year) based upon his risk factors.  He will likely have atrial fibrillation again and the only way to reduce his risk of stroke is to take anticoagulation.  ASA is not enough to lower his risk.   Xarelto is generally better covered and may be cheaper.  If he is willing to take Xarelto, his dose should be 20 mg QD. If he would rather take Eliquis, his dose is 5 mg Twice daily. We can try to submit paperwork for assistance if cost is an issue. If cost is prohibitive, we can start him on Coumadin and have him follow up in CVRR clinic.   He can stop ASA once he starts on either Eliquis or Xarelto.   Please arrange a follow up with me in 6-8 weeks after starting on anticoagulation. Richardson Dopp, PA-C    02/26/2017 5:08 PM

## 2017-02-27 NOTE — Telephone Encounter (Signed)
Lmtcb to d/w pt that PA did receive records from Cardiologist in [place]. Called to d/w pt the recommendations from Richardson Dopp, [facility] to resume an anticoagulant to reduce his risk of stroke. Per PA to have pt either start Xarelto 20 mg daily or Eliquis 5 mg BID or if cost is a problem then could start Coumadin and set up with CVRR. Once pt starts one of the anticoagulants he will then stop the ASA. I have LMOM.

## 2017-02-28 NOTE — Telephone Encounter (Signed)
Lmtcb x 2 to d/w pt the anticoagulation therapy recommendations per Brynda Rim. PA.

## 2017-03-01 ENCOUNTER — Telehealth: Payer: Self-pay | Admitting: Physician Assistant

## 2017-03-01 ENCOUNTER — Other Ambulatory Visit: Payer: Self-pay

## 2017-03-01 ENCOUNTER — Encounter: Payer: Self-pay | Admitting: Physician Assistant

## 2017-03-01 ENCOUNTER — Ambulatory Visit (HOSPITAL_COMMUNITY): Payer: Medicare Other | Attending: Internal Medicine

## 2017-03-01 DIAGNOSIS — I5030 Unspecified diastolic (congestive) heart failure: Secondary | ICD-10-CM | POA: Diagnosis not present

## 2017-03-01 DIAGNOSIS — I2510 Atherosclerotic heart disease of native coronary artery without angina pectoris: Secondary | ICD-10-CM | POA: Diagnosis not present

## 2017-03-01 DIAGNOSIS — I080 Rheumatic disorders of both mitral and aortic valves: Secondary | ICD-10-CM | POA: Insufficient documentation

## 2017-03-01 DIAGNOSIS — I420 Dilated cardiomyopathy: Secondary | ICD-10-CM | POA: Diagnosis not present

## 2017-03-01 NOTE — Telephone Encounter (Signed)
[place], thanks. If he is interested, we can offer him a referral to the CVRR clinic to review options for anticoagulation. Prior records indicate he had a rash from Amiodarone.  So, the anticoagulants are probably ok to try again. Coumadin is an alternative.  It does not look like he was ever on this before. The PharmD can help explain all of his options to him so he can make a decision. Or, if he would rather, we can schedule a follow up appointment with me to discuss.  Richardson Dopp, PA-C    03/01/2017 9:51 AM

## 2017-03-01 NOTE — Telephone Encounter (Signed)
New message ° ° ° °Pt is returning call from yesterday.  °

## 2017-03-01 NOTE — Telephone Encounter (Signed)
Spoke with patient to advise him of recommendations from [place], [place]. The patient stated that he could not take anticoagulation because of rash and bleeding issues. He interrupted me on 2 attempts to read him the message in its entirety. I listened to his concerns and asked if I could read him the entire message from [place], [place] so that he could have all the information intended for him: I received notes from his prior Cardiologist in [place].   He had atrial fibrillation documented on a heart monitor in the past and was previously on anticoagulation (Eliquis then Xarelto). He really should resume anticoagulation. His stroke risk is elevated (7.2% per year) based upon his risk factors.  He will likely have atrial fibrillation again and the only way to reduce his risk of stroke is to take anticoagulation.  ASA is not enough to lower his risk.   Xarelto is generally better covered and may be cheaper.  If he is willing to take Xarelto, his dose should be 20 mg QD. If he would rather take Eliquis, his dose is 5 mg Twice daily. We can try to submit paperwork for assistance if cost is an issue. If cost is prohibitive, we can start him on Coumadin and have him follow up in CVRR clinic.   He can stop ASA once he starts on either Eliquis or Xarelto.   Please arrange a follow up with me in 6-8 weeks after starting on anticoagulation. Richardson Dopp, PA-C    02/26/2017 5:08 PM    The patient states he will think about this and will call back with his decision.

## 2017-03-02 ENCOUNTER — Telehealth: Payer: Self-pay | Admitting: *Deleted

## 2017-03-02 ENCOUNTER — Encounter: Payer: Self-pay | Admitting: Physician Assistant

## 2017-03-02 NOTE — Telephone Encounter (Signed)
Ptcb and has been notified of echo results by phone with verbal understanding. Pt thanked me for my call today.

## 2017-03-02 NOTE — Telephone Encounter (Signed)
-----   Message from Liliane Shi, [place] sent at 03/02/2017  9:06 AM EDT ----- Please call the patient. The echocardiogram demonstrates normal ejection fraction (this is improved from last Echo done in [place] in 2013). There is mild leakage of the aortica and mitral valves and the lung pressures are mildly elevated.  This is stable compared with his last echo.  Continue current treatment plan.  Please fax a copy of this study result to his PCP:  Lujean Amel, MD  Thanks! Richardson Dopp, PA-C    03/02/2017 8:58 AM

## 2017-03-05 ENCOUNTER — Telehealth: Payer: Self-pay | Admitting: Physician Assistant

## 2017-03-05 NOTE — Telephone Encounter (Signed)
New Message ° ° pt verbalized that he is returning call for rn  °

## 2017-04-20 DIAGNOSIS — M5440 Lumbago with sciatica, unspecified side: Secondary | ICD-10-CM | POA: Diagnosis not present

## 2017-04-20 DIAGNOSIS — M328 Other forms of systemic lupus erythematosus: Secondary | ICD-10-CM | POA: Diagnosis not present

## 2017-07-25 DIAGNOSIS — E7800 Pure hypercholesterolemia, unspecified: Secondary | ICD-10-CM | POA: Diagnosis not present

## 2017-07-25 DIAGNOSIS — I25810 Atherosclerosis of coronary artery bypass graft(s) without angina pectoris: Secondary | ICD-10-CM | POA: Diagnosis not present

## 2017-07-25 DIAGNOSIS — Z0001 Encounter for general adult medical examination with abnormal findings: Secondary | ICD-10-CM | POA: Diagnosis not present

## 2017-07-25 DIAGNOSIS — I10 Essential (primary) hypertension: Secondary | ICD-10-CM | POA: Diagnosis not present

## 2017-07-25 DIAGNOSIS — Z79899 Other long term (current) drug therapy: Secondary | ICD-10-CM | POA: Diagnosis not present

## 2017-07-25 DIAGNOSIS — Z23 Encounter for immunization: Secondary | ICD-10-CM | POA: Diagnosis not present

## 2017-07-25 DIAGNOSIS — M328 Other forms of systemic lupus erythematosus: Secondary | ICD-10-CM | POA: Diagnosis not present

## 2017-08-30 DIAGNOSIS — I10 Essential (primary) hypertension: Secondary | ICD-10-CM | POA: Diagnosis not present

## 2017-11-27 DIAGNOSIS — L609 Nail disorder, unspecified: Secondary | ICD-10-CM | POA: Diagnosis not present

## 2017-11-27 DIAGNOSIS — H6090 Unspecified otitis externa, unspecified ear: Secondary | ICD-10-CM | POA: Diagnosis not present

## 2017-11-27 DIAGNOSIS — T161XXA Foreign body in right ear, initial encounter: Secondary | ICD-10-CM | POA: Diagnosis not present

## 2017-12-06 DIAGNOSIS — L821 Other seborrheic keratosis: Secondary | ICD-10-CM | POA: Diagnosis not present

## 2017-12-06 DIAGNOSIS — B351 Tinea unguium: Secondary | ICD-10-CM | POA: Diagnosis not present

## 2018-02-25 DIAGNOSIS — L601 Onycholysis: Secondary | ICD-10-CM | POA: Diagnosis not present

## 2018-02-25 DIAGNOSIS — L603 Nail dystrophy: Secondary | ICD-10-CM | POA: Diagnosis not present

## 2018-02-25 DIAGNOSIS — Z79899 Other long term (current) drug therapy: Secondary | ICD-10-CM | POA: Diagnosis not present

## 2018-03-14 DIAGNOSIS — B309 Viral conjunctivitis, unspecified: Secondary | ICD-10-CM | POA: Diagnosis not present

## 2018-03-14 DIAGNOSIS — J069 Acute upper respiratory infection, unspecified: Secondary | ICD-10-CM | POA: Diagnosis not present

## 2018-03-14 DIAGNOSIS — R05 Cough: Secondary | ICD-10-CM | POA: Diagnosis not present

## 2018-04-09 DIAGNOSIS — B351 Tinea unguium: Secondary | ICD-10-CM | POA: Diagnosis not present

## 2018-04-24 DIAGNOSIS — M62830 Muscle spasm of back: Secondary | ICD-10-CM | POA: Diagnosis not present

## 2018-04-24 DIAGNOSIS — N401 Enlarged prostate with lower urinary tract symptoms: Secondary | ICD-10-CM | POA: Diagnosis not present

## 2018-05-08 DIAGNOSIS — N401 Enlarged prostate with lower urinary tract symptoms: Secondary | ICD-10-CM | POA: Diagnosis not present

## 2018-08-09 DIAGNOSIS — I10 Essential (primary) hypertension: Secondary | ICD-10-CM | POA: Diagnosis not present

## 2018-08-09 DIAGNOSIS — N401 Enlarged prostate with lower urinary tract symptoms: Secondary | ICD-10-CM | POA: Diagnosis not present

## 2018-08-09 DIAGNOSIS — M328 Other forms of systemic lupus erythematosus: Secondary | ICD-10-CM | POA: Diagnosis not present

## 2018-08-09 DIAGNOSIS — I25810 Atherosclerosis of coronary artery bypass graft(s) without angina pectoris: Secondary | ICD-10-CM | POA: Diagnosis not present

## 2018-08-09 DIAGNOSIS — Z0001 Encounter for general adult medical examination with abnormal findings: Secondary | ICD-10-CM | POA: Diagnosis not present

## 2018-08-09 DIAGNOSIS — E7800 Pure hypercholesterolemia, unspecified: Secondary | ICD-10-CM | POA: Diagnosis not present

## 2018-08-09 DIAGNOSIS — Z79899 Other long term (current) drug therapy: Secondary | ICD-10-CM | POA: Diagnosis not present

## 2018-08-09 DIAGNOSIS — Z23 Encounter for immunization: Secondary | ICD-10-CM | POA: Diagnosis not present

## 2018-11-12 DIAGNOSIS — J069 Acute upper respiratory infection, unspecified: Secondary | ICD-10-CM | POA: Diagnosis not present

## 2018-11-12 DIAGNOSIS — J40 Bronchitis, not specified as acute or chronic: Secondary | ICD-10-CM | POA: Diagnosis not present

## 2019-06-10 DIAGNOSIS — Z23 Encounter for immunization: Secondary | ICD-10-CM | POA: Diagnosis not present

## 2019-06-11 DIAGNOSIS — H1045 Other chronic allergic conjunctivitis: Secondary | ICD-10-CM | POA: Diagnosis not present

## 2019-06-11 DIAGNOSIS — J3089 Other allergic rhinitis: Secondary | ICD-10-CM | POA: Diagnosis not present

## 2019-06-20 DIAGNOSIS — H43813 Vitreous degeneration, bilateral: Secondary | ICD-10-CM | POA: Diagnosis not present

## 2019-07-08 DIAGNOSIS — R519 Headache, unspecified: Secondary | ICD-10-CM | POA: Diagnosis not present

## 2019-07-08 DIAGNOSIS — J309 Allergic rhinitis, unspecified: Secondary | ICD-10-CM | POA: Diagnosis not present

## 2019-08-26 DIAGNOSIS — R0981 Nasal congestion: Secondary | ICD-10-CM | POA: Diagnosis not present

## 2019-08-26 DIAGNOSIS — Z0001 Encounter for general adult medical examination with abnormal findings: Secondary | ICD-10-CM | POA: Diagnosis not present

## 2019-08-26 DIAGNOSIS — I25810 Atherosclerosis of coronary artery bypass graft(s) without angina pectoris: Secondary | ICD-10-CM | POA: Diagnosis not present

## 2019-08-26 DIAGNOSIS — Z79899 Other long term (current) drug therapy: Secondary | ICD-10-CM | POA: Diagnosis not present

## 2019-08-26 DIAGNOSIS — I10 Essential (primary) hypertension: Secondary | ICD-10-CM | POA: Diagnosis not present

## 2019-08-26 DIAGNOSIS — M328 Other forms of systemic lupus erythematosus: Secondary | ICD-10-CM | POA: Diagnosis not present

## 2019-08-26 DIAGNOSIS — N401 Enlarged prostate with lower urinary tract symptoms: Secondary | ICD-10-CM | POA: Diagnosis not present

## 2019-08-26 DIAGNOSIS — E7800 Pure hypercholesterolemia, unspecified: Secondary | ICD-10-CM | POA: Diagnosis not present

## 2019-09-29 DIAGNOSIS — Z79899 Other long term (current) drug therapy: Secondary | ICD-10-CM | POA: Diagnosis not present

## 2019-09-29 DIAGNOSIS — E7800 Pure hypercholesterolemia, unspecified: Secondary | ICD-10-CM | POA: Diagnosis not present

## 2019-11-11 DIAGNOSIS — J329 Chronic sinusitis, unspecified: Secondary | ICD-10-CM | POA: Diagnosis not present

## 2019-11-11 DIAGNOSIS — R519 Headache, unspecified: Secondary | ICD-10-CM | POA: Diagnosis not present

## 2019-11-26 DIAGNOSIS — I10 Essential (primary) hypertension: Secondary | ICD-10-CM | POA: Diagnosis not present

## 2019-11-26 DIAGNOSIS — Z79899 Other long term (current) drug therapy: Secondary | ICD-10-CM | POA: Diagnosis not present

## 2019-11-26 DIAGNOSIS — R0981 Nasal congestion: Secondary | ICD-10-CM | POA: Diagnosis not present

## 2019-12-11 DIAGNOSIS — Z79899 Other long term (current) drug therapy: Secondary | ICD-10-CM | POA: Diagnosis not present

## 2019-12-31 DIAGNOSIS — I25810 Atherosclerosis of coronary artery bypass graft(s) without angina pectoris: Secondary | ICD-10-CM | POA: Diagnosis not present

## 2019-12-31 DIAGNOSIS — R0602 Shortness of breath: Secondary | ICD-10-CM | POA: Diagnosis not present

## 2020-01-07 ENCOUNTER — Ambulatory Visit
Admission: RE | Admit: 2020-01-07 | Discharge: 2020-01-07 | Disposition: A | Discharge status: Home / Self Care | Payer: Medicare Other | Source: Ambulatory Visit | Attending: Family Medicine | Admitting: Family Medicine

## 2020-01-07 ENCOUNTER — Other Ambulatory Visit: Payer: Self-pay

## 2020-01-07 ENCOUNTER — Other Ambulatory Visit: Payer: Self-pay | Admitting: Family Medicine

## 2020-01-07 DIAGNOSIS — R0602 Shortness of breath: Secondary | ICD-10-CM

## 2020-05-14 DIAGNOSIS — Z0001 Encounter for general adult medical examination with abnormal findings: Secondary | ICD-10-CM | POA: Diagnosis not present

## 2020-05-14 DIAGNOSIS — I25810 Atherosclerosis of coronary artery bypass graft(s) without angina pectoris: Secondary | ICD-10-CM | POA: Diagnosis not present

## 2020-05-14 DIAGNOSIS — E7800 Pure hypercholesterolemia, unspecified: Secondary | ICD-10-CM | POA: Diagnosis not present

## 2020-05-14 DIAGNOSIS — Z79899 Other long term (current) drug therapy: Secondary | ICD-10-CM | POA: Diagnosis not present

## 2020-05-14 DIAGNOSIS — N401 Enlarged prostate with lower urinary tract symptoms: Secondary | ICD-10-CM | POA: Diagnosis not present

## 2020-05-14 DIAGNOSIS — M328 Other forms of systemic lupus erythematosus: Secondary | ICD-10-CM | POA: Diagnosis not present

## 2020-05-14 DIAGNOSIS — I10 Essential (primary) hypertension: Secondary | ICD-10-CM | POA: Diagnosis not present

## 2020-05-24 NOTE — Progress Notes (Signed)
Cardiology Office Note:    Date:  05/25/2020   ID:  Johnny Morgan, DOB 1940-10-11, MRN [id number]  PCP:  Johnny Amel, MD  Cardiologist:  No primary care provider on file.   Referring MD: Johnny Amel, MD   Chief Complaint  Patient presents with  . Coronary Artery Disease  . Atrial Fibrillation    History of Present Illness:    Johnny Morgan is a 79 y.o. male with a hx of hx of CAD s/p CABG in 2010, HTN, HL, Lupus, PAF (post op AFib - took amiodarone but stopped due to dizziness)  He is referred by Johnny Morgan to establish for longitudinal care.  He is pretty compliant.  He voices no complaints.  He specifically denies angina which in his case was back discomfort when CABG was performed in 2010.  He relates that he required a balloon pump that they could not get up his right leg and had to place in his left leg.  He is always wondered if he has clogged up arteries on the right leg.  He does not have any difficulty with ambulation.  He denies claudication.  Much of his time is spent caring for his wife.  She has difficulty with ambulation.  He has to push around in a wheelchair.  He denies orthopnea, PND, palpitations, transient neurological symptoms, claudication, peripheral edema, fainting or near fainting.  No specific medication side effects.  Please see the updated List Noted.  Last seen by Johnny Morgan in 2018 when LV function was normal.  Past Medical History:  Diagnosis Date  . CAD (coronary artery disease)    s/p NSTEMI in 3/11 >> LHC with high grade LM disease, EF 35-40 >> s/p CABG at [facility] in [place] ([address] [address]) - (L-LAD, S-PLA, S-OM) // Myoview 07/2010: EF 50, normal perfusion  . Essential hypertension   . History of MI (myocardial infarction)   . HLD (hyperlipidemia)   . Ischemic cardiomyopathy    EF at time of MI and CABG in 2011: 35-40% // Echo 7/13: mild ant HK, EF 40-45, mild BAE, mild MR, mild AI, mild TR, mod pulmonary HTN,  RVSP 54 // Echo 6/18: EF 55-60, Gr 1 DD, mild AI, mild MR, mild LAE, mild to mod reduced RVSF, PASP 31   . 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 Nephrolithiasis   . PAF (paroxysmal atrial fibrillation) ([place])    notes from [place] indicate prior Holter showed AF // patient was on Amiodarone + Apixaban for 2-3 years // patient stopped Amio due to rash in 2017 // pt stopped anticoag due to cost (Eliquis and Xarelto) // Holter in 11/15 (on Amio) neg for AFib   . SLE (systemic lupus erythematosus) ([place])    prior hx of long term prednisone use  . SOB (shortness of breath)     Past Surgical History:  Procedure Laterality Date  . CATARACT EXTRACTION    . COLONOSCOPY  2017  . CORONARY ARTERY BYPASS GRAFT  2010  . INGUINAL HERNIA REPAIR Bilateral 1992  . Partial Shoulder Replacement Right 2008  . THORACOTOMY  1989   Dx with [facility]  . TOTAL HIP ARTHROPLASTY Right 1999    Current Medications: Current Meds  Medication Sig  . aspirin EC 81 MG tablet Take 81 mg by mouth daily.  Marland Kitchen atorvastatin (LIPITOR) 10 MG tablet Take 10 mg by mouth daily.  . irbesartan (AVAPRO) 75 MG tablet Take 75 mg by  mouth daily.  Marland Kitchen METOPROLOL SUCCINATE ER PO Take 25 mg by mouth daily.  Marland Kitchen omeprazole (PRILOSEC) 20 MG capsule Take 20 mg by mouth daily.  . tamsulosin (FLOMAX) 0.4 MG CAPS capsule Take 0.4 mg by mouth daily.  . [DISCONTINUED] AMLODIPINE BESYLATE PO Take 5 mg by mouth daily.  . [DISCONTINUED] atorvastatin (LIPITOR) 40 MG tablet Take 40 mg by mouth daily.  . [DISCONTINUED] hydrochlorothiazide (HYDRODIURIL) 12.5 MG tablet Take 12.5 mg by mouth every morning.     Allergies:   Penicillins, Sulfa antibiotics, and Tamiflu [oseltamivir phosphate]   Social History   Socioeconomic History  . Marital status: Divorced    Spouse name: Not on file  . Number of children: Not on file  . Years of education: Not on file  . Highest education level: Not on file  Occupational History  .  Occupation: Retired    Comment: Prior Games developer  Tobacco Use  . Smoking status: Former Smoker    Quit date: 1990    Years since quitting: 31.7  . Smokeless tobacco: Never Used  Substance and Sexual Activity  . Alcohol use: No  . Drug use: No  . Sexual activity: Not on file  Other Topics Concern  . Not on file  Social History Narrative   Moved to [place] from [place] area in 05/2016   Divorced   3 children   Social Determinants of Health   Financial Resource Strain:   . Difficulty of Paying Living Expenses: Not on file  Food Insecurity:   . Worried About Charity fundraiser in the Last Year: Not on file  . Ran Out of Food in the Last Year: Not on file  Transportation Needs:   . Lack of Transportation (Medical): Not on file  . Lack of Transportation (Non-Medical): Not on file  Physical Activity:   . Days of Exercise per Week: Not on file  . Minutes of Exercise per Session: Not on file  Stress:   . Feeling of Stress : Not on file  Social Connections:   . Frequency of Communication with Friends and Family: Not on file  . Frequency of Social Gatherings with Friends and Family: Not on file  . Attends Religious Services: Not on file  . Active Member of Clubs or Organizations: Not on file  . Attends Archivist Meetings: Not on file  . Marital Status: Not on file     Family History: The patient's family history includes Heart disease in his brother, sister, and sister.  ROS:   Please see the history of present illness.    Progressive difficulty with ambulation but he feels it is age related.  No difficulty sleeping.  Balance is not as good as it has been in the past.  Still driving and independent.  All other systems reviewed and are negative.  EKGs/Labs/Other Studies Reviewed:    The following studies were reviewed today:  2D Doppler echocardiogram 2018 Study Conclusions   - Left ventricle: The cavity size was normal. Wall thickness was  normal. Systolic  function was normal. The estimated ejection  fraction was in the range of 55% to 60%. Doppler parameters are  consistent with abnormal left ventricular relaxation (grade 1  diastolic dysfunction).  - Aortic valve: There was mild regurgitation.  - Mitral valve: There was mild regurgitation.  - Left atrium: The atrium was mildly dilated.  - Right ventricle: The cavity size was mildly dilated. Systolic  function was mildly to moderately reduced.  -  Pulmonary arteries: PA peak pressure: 31 mm Hg (S).   EKG:  EKG normal sinus rhythm, left anterior hemiblock, right bundle branch block, sinus pause.  When compared to prior the sinus pauses and left anterior hemiblock removed.  Recent Labs: No results found for requested labs within last 8760 hours.  Recent Lipid Panel No results found for: CHOL, TRIG, HDL, CHOLHDL, VLDL, LDLCALC, LDLDIRECT  Physical Exam:    VS:  BP 132/86   Pulse 65   Ht 5\' 9"  (1.753 m)   Wt 154 lb 3.2 oz (69.9 kg)   SpO2 91%   BMI 22.77 kg/m     Wt Readings from Last 3 Encounters:  05/25/20 154 lb 3.2 oz (69.9 kg)  02/16/17 158 lb 6.4 oz (71.8 kg)     GEN: Slender and appearance is compatible with age.. No acute distress HEENT: Normal NECK: No JVD. LYMPHATICS: No lymphadenopathy CARDIAC:  RRR without murmur, gallop, or edema. VASCULAR:  Normal Pulses. No bruits. RESPIRATORY:  Clear to auscultation without rales, wheezing or rhonchi  ABDOMEN: Soft, non-tender, non-distended, No pulsatile mass, MUSCULOSKELETAL: No deformity  SKIN: Warm and dry NEUROLOGIC:  Morgan and oriented x 3 PSYCHIATRIC:  Normal affect   ASSESSMENT:    1. Coronary artery disease of bypass graft of native heart with stable angina pectoris ([place])   2. Essential hypertension   3. RBBB   4. Hyperlipidemia, unspecified hyperlipidemia type   5. Ischemic cardiomyopathy   6. Systemic lupus erythematosus, unspecified SLE type, unspecified organ involvement status ([place])   7.  Bifascicular block   8. Sinus pause   9. Educated about COVID-19 virus infection    PLAN:    In order of problems listed above:  1. Secondary prevention is discussed. 2. Blood pressure is in excellent control on beta-blocker, ARB and salt restriction. 3. Has progressed to bifascicular block.  He has a pause on his EKG of less than 2 seconds.  Prior history of atrial fibrillation.  3-day monitor to exclude asymptomatic atrial fibrillation and or high-grade AV block. 4. Continue statin therapy to maintain LDL less than 70.  Most recent LDL was 52 on 10 mg of atorvastatin. 5. Last echocardiogram demonstrated normal systolic function.  No clinical evidence of worsening.  Will follow clinically. 6. Did not address lupus 7. See above 8. See above 9. Vaccinated and Audiological scientist.   Overall education and awareness concerning secondary risk prevention was discussed in detail: LDL less than 70, hemoglobin A1c less than 7, blood pressure target less than 130/80 mmHg, >150 minutes of moderate aerobic activity per week, avoidance of smoking, weight control (via diet and exercise), and continued surveillance/management of/for obstructive sleep apnea.  We will plan clinical follow-up in 1 year related to CAD, history of rhythm disturbance, and conduction abnormality on EKG.   Medication Adjustments/Labs and Tests Ordered: Current medicines are reviewed at length with the patient today.  Concerns regarding medicines are outlined above.  Orders Placed This Encounter  Procedures  . LONG TERM MONITOR (3-14 DAYS)  . EKG 12-Lead   No orders of the defined types were placed in this encounter.   Patient Instructions  Medication Instructions:  Your physician recommends that you continue on your current medications as directed. Please refer to the Current Medication list given to you today.  *If you need a refill on your cardiac medications before your next appointment, please call your  pharmacy*   Lab Work: None If you have labs (blood work) drawn today and your  tests are completely normal, you will receive your results only by: Marland Kitchen MyChart Message (if you have MyChart) OR . A paper copy in the mail If you have any lab test that is abnormal or we need to change your treatment, we will call you to review the results.   Testing/Procedures: Your physician recommends that you wear a monitor for 3 days.   Follow-Up: At [facility], you and your health needs are our priority.  As part of our continuing mission to provide you with exceptional heart care, we have created designated Provider Care Teams.  These Care Teams include your primary Cardiologist (physician) and Advanced Practice Providers (APPs -  Physician Assistants and Nurse Practitioners) who all work together to provide you with the care you need, when you need it.  We recommend signing up for the patient portal called "MyChart".  Sign up information is provided on this After Visit Summary.  MyChart is used to connect with patients for Virtual Visits (Telemedicine).  Patients are able to view lab/test results, encounter notes, upcoming appointments, etc.  Non-urgent messages can be sent to your provider as well.   To learn more about what you can do with MyChart, go to [email or web address].    Your next appointment:   12 month(s)  The format for your next appointment:   In Person  Provider:   You may see Dr. Daneen Schick or one of the following Advanced Practice Providers on your designated Care Team:    Truitt Merle, NP  Cecilie Kicks, NP  Kathyrn Drown, NP    Other Instructions  [place] Monitor Instructions   Your physician has requested you wear your ZIO patch monitor _3_days.   This is a single patch monitor.  [employer] supplies one patch monitor per enrollment.  Additional stickers are not available.   Please do not apply patch if you will be having a Nuclear Stress Test,  Echocardiogram, Cardiac CT, MRI, or Chest Xray during the time frame you would be wearing the monitor. The patch cannot be worn during these tests.  You cannot remove and re-apply the ZIO XT patch monitor.   Your ZIO patch monitor will be sent USPS Priority mail from [facility] directly to your home address. The monitor may also be mailed to a PO BOX if home delivery is not available.   It may take 3-5 days to receive your monitor after you have been enrolled.   Once you have received you monitor, please review enclosed instructions.  Your monitor has already been registered assigning a specific monitor serial # to you.   Applying the monitor   Shave hair from upper left chest.   Hold abrader disc by orange tab.  Rub abrader in 40 strokes over left upper chest as indicated in your monitor instructions.   Clean area with 4 enclosed alcohol pads .  Use all pads to assure are is cleaned thoroughly.  Let dry.   Apply patch as indicated in monitor instructions.  Patch will be place under collarbone on left side of chest with arrow pointing upward.   Rub patch adhesive wings for 2 minutes.Remove white label marked "1".  Remove white label marked "2".  Rub patch adhesive wings for 2 additional minutes.   While looking in a mirror, press and release button in center of patch.  A small green light will flash 3-4 times .  This will be your only indicator the monitor has been turned on.  Do not shower for the first 24 hours.  You may shower after the first 24 hours.   Press button if you feel a symptom. You will hear a small click.  Record Date, Time and Symptom in the Patient Log Book.   When you are ready to remove patch, follow instructions on last 2 pages of Patient Log Book.  Stick patch monitor onto last page of Patient Log Book.   Place Patient Log Book in [place] box.  Use locking tab on box and tape box closed securely.  The [employer] and [employer] has [employer] on it.  Please  place in mailbox as soon as possible.  Your physician should have your test results approximately 7 days after the monitor has been mailed back to [facility].   Call [place] at [phone] if you have questions regarding your ZIO XT patch monitor.  Call them immediately if you see an orange light blinking on your monitor.   If your monitor falls off in less than 4 days contact our Monitor department at [phone].  If your monitor becomes loose or falls off after 4 days call [employer] at [phone] for suggestions on securing your monitor.       Signed, Sinclair Grooms, MD  05/25/2020 4:26 PM    [place]

## 2020-05-25 ENCOUNTER — Ambulatory Visit (INDEPENDENT_AMBULATORY_CARE_PROVIDER_SITE_OTHER): Payer: Medicare HMO | Admitting: Interventional Cardiology

## 2020-05-25 ENCOUNTER — Encounter: Payer: Self-pay | Admitting: Interventional Cardiology

## 2020-05-25 ENCOUNTER — Other Ambulatory Visit: Payer: Self-pay

## 2020-05-25 VITALS — BP 132/86 | HR 65 | Ht 69.0 in | Wt 154.2 lb

## 2020-05-25 DIAGNOSIS — I452 Bifascicular block: Secondary | ICD-10-CM

## 2020-05-25 DIAGNOSIS — I455 Other specified heart block: Secondary | ICD-10-CM | POA: Diagnosis not present

## 2020-05-25 DIAGNOSIS — I4510 Unspecified right bundle-branch block: Secondary | ICD-10-CM | POA: Diagnosis not present

## 2020-05-25 DIAGNOSIS — I10 Essential (primary) hypertension: Secondary | ICD-10-CM

## 2020-05-25 DIAGNOSIS — I25708 Atherosclerosis of coronary artery bypass graft(s), unspecified, with other forms of angina pectoris: Secondary | ICD-10-CM

## 2020-05-25 DIAGNOSIS — E785 Hyperlipidemia, unspecified: Secondary | ICD-10-CM | POA: Diagnosis not present

## 2020-05-25 DIAGNOSIS — M329 Systemic lupus erythematosus, unspecified: Secondary | ICD-10-CM

## 2020-05-25 DIAGNOSIS — I255 Ischemic cardiomyopathy: Secondary | ICD-10-CM | POA: Diagnosis not present

## 2020-05-25 DIAGNOSIS — Z7189 Other specified counseling: Secondary | ICD-10-CM | POA: Diagnosis not present

## 2020-05-25 NOTE — Patient Instructions (Signed)
Medication Instructions:  Your physician recommends that you continue on your current medications as directed. Please refer to the Current Medication list given to you today.  *If you need a refill on your cardiac medications before your next appointment, please call your pharmacy*   Lab Work: None If you have labs (blood work) drawn today and your tests are completely normal, you will receive your results only by: Marland Kitchen MyChart Message (if you have MyChart) OR . A paper copy in the mail If you have any lab test that is abnormal or we need to change your treatment, we will call you to review the results.   Testing/Procedures: Your physician recommends that you wear a monitor for 3 days.   Follow-Up: At [facility], you and your health needs are our priority.  As part of our continuing mission to provide you with exceptional heart care, we have created designated Provider Care Teams.  These Care Teams include your primary Cardiologist (physician) and Advanced Practice Providers (APPs -  Physician Assistants and Nurse Practitioners) who all work together to provide you with the care you need, when you need it.  We recommend signing up for the patient portal called "MyChart".  Sign up information is provided on this After Visit Summary.  MyChart is used to connect with patients for Virtual Visits (Telemedicine).  Patients are able to view lab/test results, encounter notes, upcoming appointments, etc.  Non-urgent messages can be sent to your provider as well.   To learn more about what you can do with MyChart, go to [email or web address].    Your next appointment:   12 month(s)  The format for your next appointment:   In Person  Provider:   You may see Dr. Daneen Schick or one of the following Advanced Practice Providers on your designated Care Team:    Truitt Merle, NP  Cecilie Kicks, NP  Kathyrn Drown, NP    Other Instructions  [place] Monitor Instructions   Your  physician has requested you wear your ZIO patch monitor _3_days.   This is a single patch monitor.  Ir[employer] supplies one patch monitor per enrollment.  Additional stickers are not available.   Please do not apply patch if you will be having a Nuclear Stress Test, Echocardiogram, Cardiac CT, MRI, or Chest Xray during the time frame you would be wearing the monitor. The patch cannot be worn during these tests.  You cannot remove and re-apply the ZIO XT patch monitor.   Your ZIO patch monitor will be sent USPS Priority mail from [facility] directly to your home address. The monitor may also be mailed to a PO BOX if home delivery is not available.   It may take 3-5 days to receive your monitor after you have been enrolled.   Once you have received you monitor, please review enclosed instructions.  Your monitor has already been registered assigning a specific monitor serial # to you.   Applying the monitor   Shave hair from upper left chest.   Hold abrader disc by orange tab.  Rub abrader in 40 strokes over left upper chest as indicated in your monitor instructions.   Clean area with 4 enclosed alcohol pads .  Use all pads to assure are is cleaned thoroughly.  Let dry.   Apply patch as indicated in monitor instructions.  Patch will be place under collarbone on left side of chest with arrow pointing upward.   Rub patch adhesive wings for 2 minutes.Remove white  label marked "1".  Remove white label marked "2".  Rub patch adhesive wings for 2 additional minutes.   While looking in a mirror, press and release button in center of patch.  A small green light will flash 3-4 times .  This will be your only indicator the monitor has been turned on.     Do not shower for the first 24 hours.  You may shower after the first 24 hours.   Press button if you feel a symptom. You will hear a small click.  Record Date, Time and Symptom in the Patient Log Book.   When you are ready to remove patch,  follow instructions on last 2 pages of Patient Log Book.  Stick patch monitor onto last page of Patient Log Book.   Place Patient Log Book in [place] box.  Use locking tab on box and tape box closed securely.  The [employer] and [employer] has [employer] on it.  Please place in mailbox as soon as possible.  Your physician should have your test results approximately 7 days after the monitor has been mailed back to [facility].   Call [place] at [phone] if you have questions regarding your ZIO XT patch monitor.  Call them immediately if you see an orange light blinking on your monitor.   If your monitor falls off in less than 4 days contact our Monitor department at [phone].  If your monitor becomes loose or falls off after 4 days call [employer] at [phone] for suggestions on securing your monitor.

## 2020-05-26 ENCOUNTER — Telehealth: Payer: Self-pay | Admitting: Radiology

## 2020-05-26 NOTE — Telephone Encounter (Signed)
Enrolled patient for a 3 day Zio XT monitor to be mailed to patients home  

## 2020-05-28 ENCOUNTER — Ambulatory Visit (INDEPENDENT_AMBULATORY_CARE_PROVIDER_SITE_OTHER): Payer: Medicare HMO

## 2020-05-28 DIAGNOSIS — I4510 Unspecified right bundle-branch block: Secondary | ICD-10-CM | POA: Diagnosis not present

## 2020-05-28 DIAGNOSIS — I452 Bifascicular block: Secondary | ICD-10-CM | POA: Diagnosis not present

## 2020-06-09 DIAGNOSIS — I4510 Unspecified right bundle-branch block: Secondary | ICD-10-CM | POA: Diagnosis not present

## 2020-06-09 DIAGNOSIS — I452 Bifascicular block: Secondary | ICD-10-CM | POA: Diagnosis not present

## 2020-10-16 IMAGING — CR DG CHEST 2V
2 series · 2 of 2 positions shown · non-contrast
Comparison: 03/14/2018

CLINICAL DATA: Shortness of breath.

EXAM:
CHEST - 2 VIEW

[w chest pa]
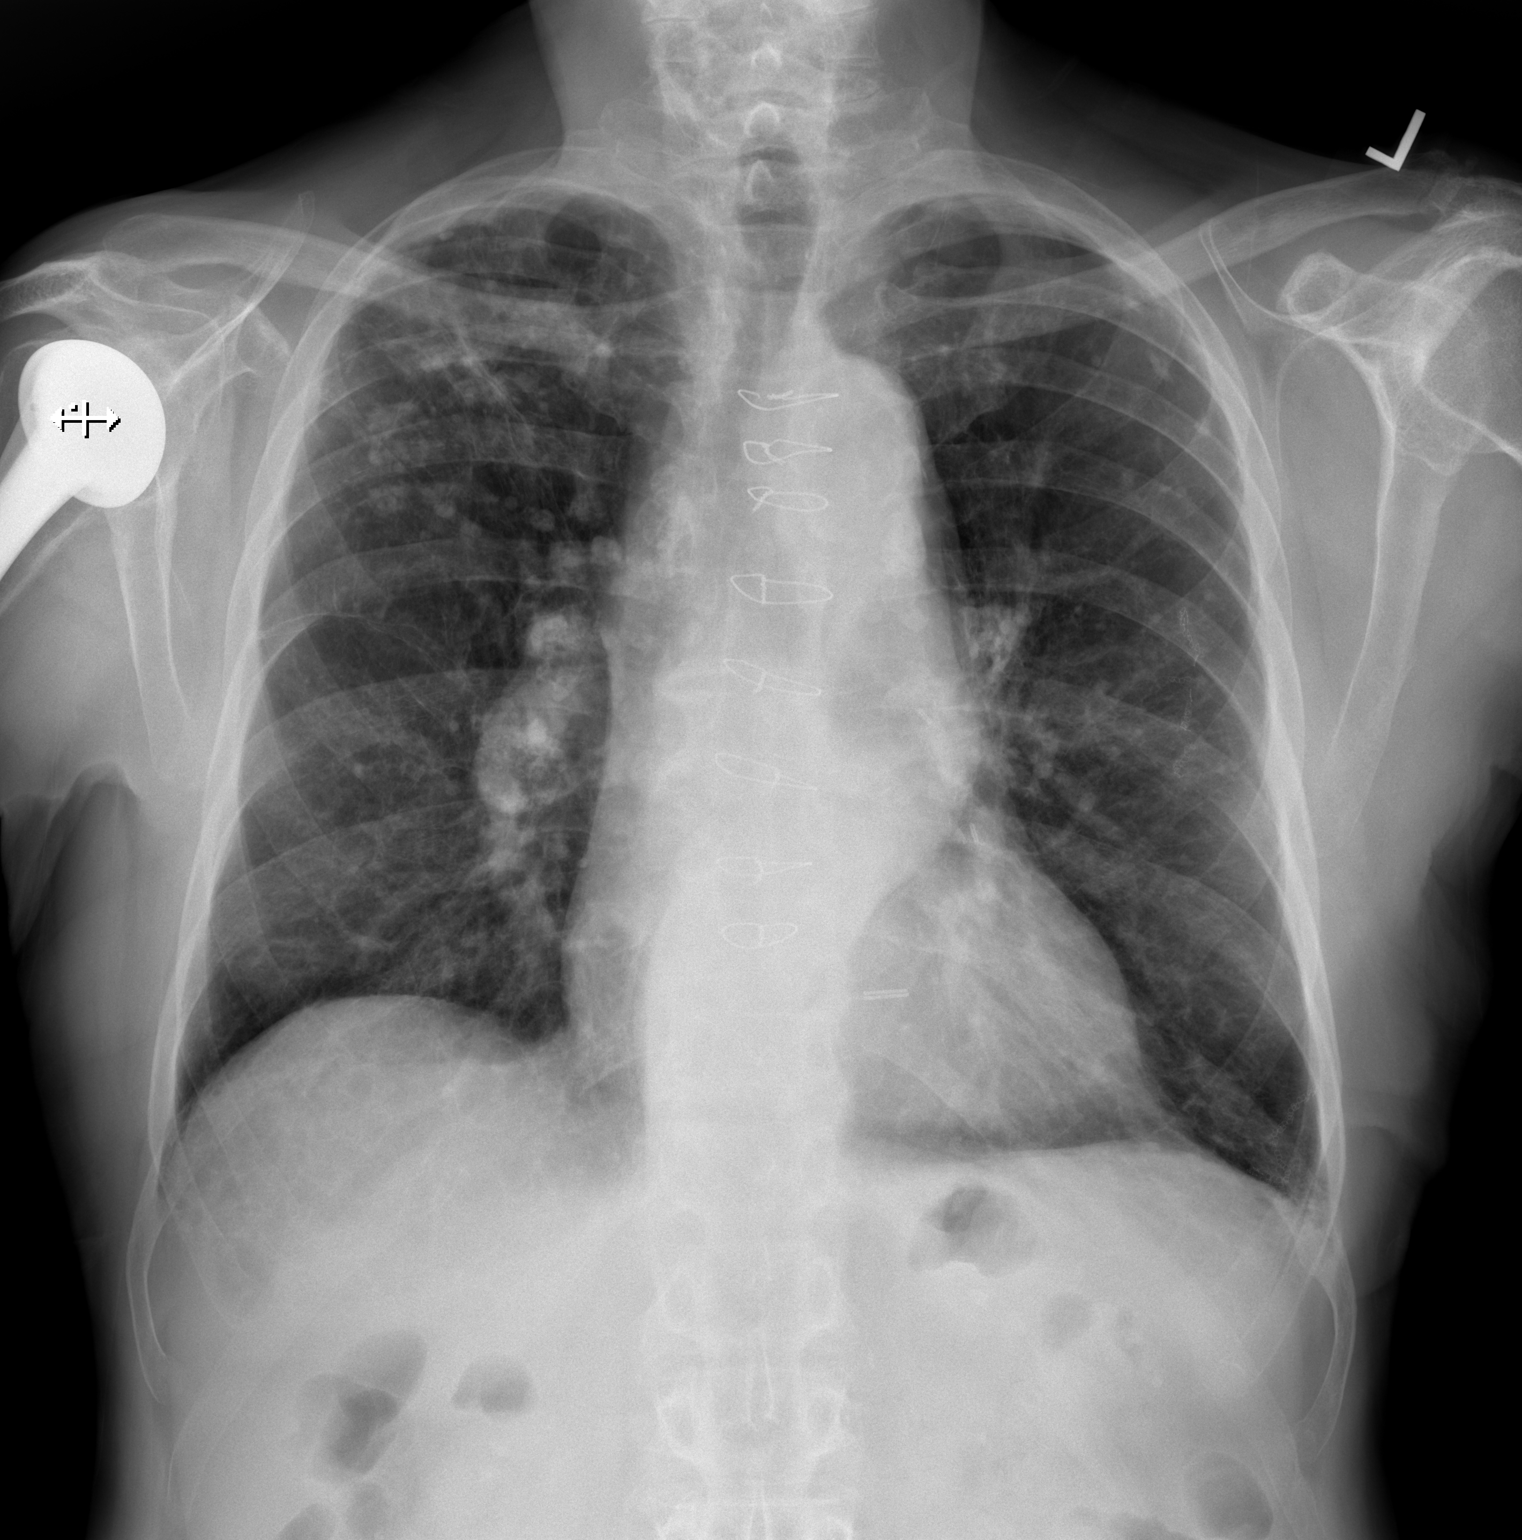

[w chest lat]
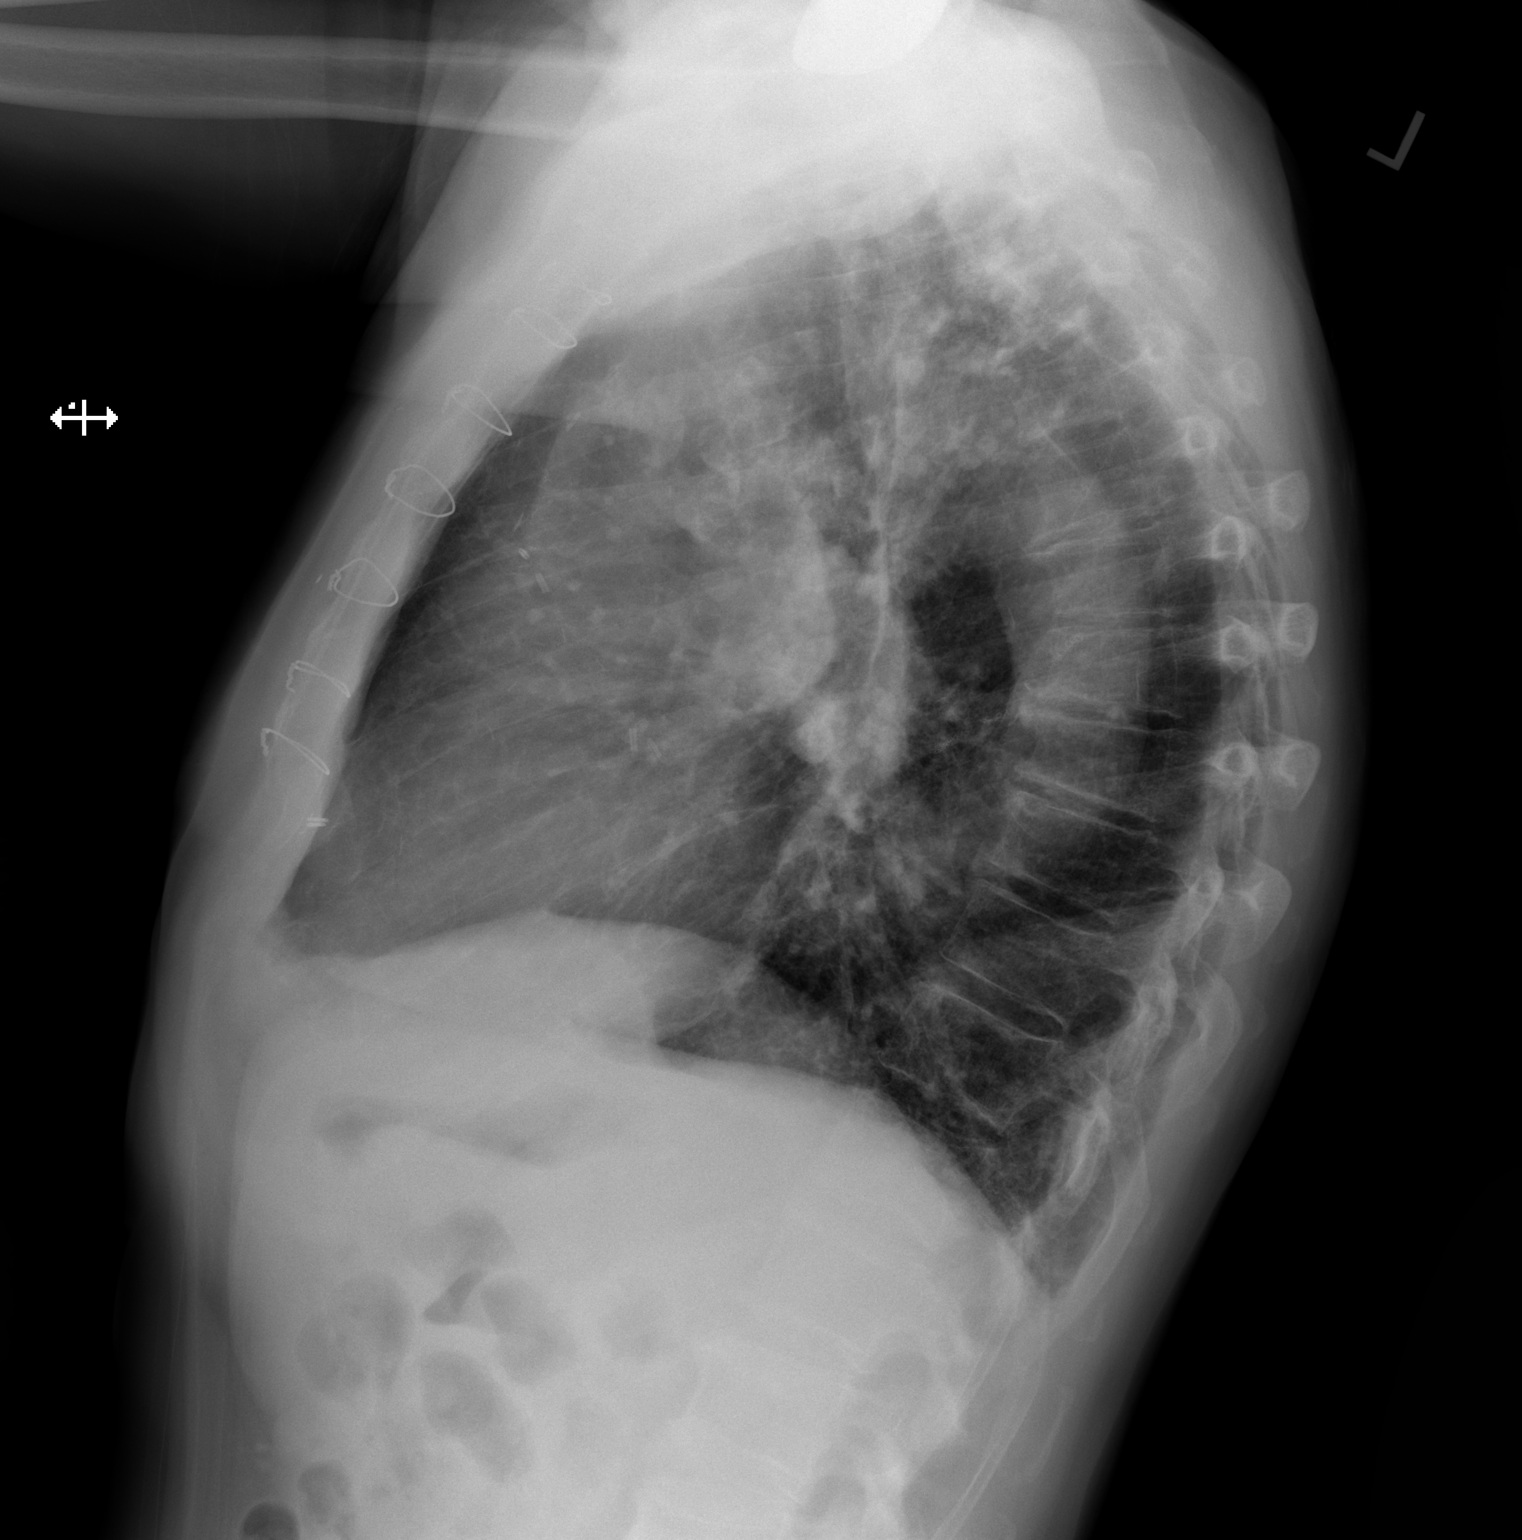

[2 of 2 positions shown; findings below may reference images not displayed]

FINDINGS: Lungs are hyperexpanded. The upper lung predominant, right greater
than left granulomatous disease again noted. Calcified nodal tissue
is seen in the hilar regions and mediastinum. No focal airspace
consolidation, pulmonary edema, or pleural effusion. Scarring at the
left base is stable. The cardiopericardial silhouette is within
normal limits for size. The visualized bony structures of the thorax
are intact.
IMPRESSION: Stable exam.  No new or acute interval findings.

## 2020-12-01 DIAGNOSIS — N400 Enlarged prostate without lower urinary tract symptoms: Secondary | ICD-10-CM | POA: Diagnosis not present

## 2020-12-01 DIAGNOSIS — E785 Hyperlipidemia, unspecified: Secondary | ICD-10-CM | POA: Diagnosis not present

## 2020-12-01 DIAGNOSIS — I2510 Atherosclerotic heart disease of native coronary artery without angina pectoris: Secondary | ICD-10-CM | POA: Diagnosis not present

## 2020-12-01 DIAGNOSIS — Z7722 Contact with and (suspected) exposure to environmental tobacco smoke (acute) (chronic): Secondary | ICD-10-CM | POA: Diagnosis not present

## 2020-12-01 DIAGNOSIS — K219 Gastro-esophageal reflux disease without esophagitis: Secondary | ICD-10-CM | POA: Diagnosis not present

## 2020-12-01 DIAGNOSIS — R269 Unspecified abnormalities of gait and mobility: Secondary | ICD-10-CM | POA: Diagnosis not present

## 2020-12-01 DIAGNOSIS — I10 Essential (primary) hypertension: Secondary | ICD-10-CM | POA: Diagnosis not present

## 2020-12-01 DIAGNOSIS — I252 Old myocardial infarction: Secondary | ICD-10-CM | POA: Diagnosis not present

## 2020-12-01 DIAGNOSIS — J309 Allergic rhinitis, unspecified: Secondary | ICD-10-CM | POA: Diagnosis not present

## 2020-12-01 DIAGNOSIS — M329 Systemic lupus erythematosus, unspecified: Secondary | ICD-10-CM | POA: Diagnosis not present

## 2021-05-19 NOTE — Progress Notes (Signed)
Cardiology Office Note:    Date:  05/20/2021   ID:  [name], DOB May 21, 1941, MRN [id number]  PCP:  Lujean Amel, MD   [facility] Providers Cardiologist:  Sinclair Grooms, MD   Referring MD: Lujean Amel, MD   Chief Complaint:  Follow-up on CAD    Patient Profile:    Johnny Morgan is a 80 y.o. male with:  Coronary artery disease  NSTEMI >> S/p CABG in 2010 Ischemic CM (EF 35-40 at time of MI in 2010) Echocardiogram 6/18: EF 55-60 Paroxysmal atrial fibrillation (post op) Mild AI, Mild MR (echocardiogram 2018) Hypertension  Hyperlipidemia  Systemic Lupus Erythematosus  Hx of Legionnaire's disease  RBBB  Prior CV studies:  LONG TERM MONITOR (3-7 DAYS) INTERPRETATION 06/10/2020 Narrative  Normal sinus rhythm and sinus bradycardia on the predominant underlying rhythm.  Isolated PACs and PVCs are noted.  1 brief instance of ventricular bigeminy.  No atrial fibrillation.  Instances of ectopic atrial rhythm and ectopic atrial tachycardia, short lived.  ECHOCARDIOGRAM 03/01/17 EF 55-60, GR 1 DD, mild AI, mild MR, mild LAE, mild to moderately reduced RVSF, PASP 31  History of Present Illness: Johnny Morgan was last seen by Dr. Tamala Julian in 9/21.  He returns for f/u.  He is here today with his wife.  She is in a wheelchair and he had to push her up from the parking lot.  This may explain his higher heart rate.   He has not had chest pain, significant shortness of breath, orthopnea, leg edema or syncope.    Past Medical History:  Diagnosis Date   CAD (coronary artery disease)    s/p NSTEMI in 3/11 >> LHC with high grade LM disease, EF 35-40 >> s/p CABG at [facility] in [place] ([address] [address]) - (L-LAD, S-PLA, S-OM) // Myoview 07/2010: EF 50, normal perfusion   Essential hypertension    History of MI (myocardial infarction)    HLD (hyperlipidemia)    Ischemic cardiomyopathy    EF at time of MI and CABG in 2011: 35-40% // Echo 7/13: mild ant HK,  EF 40-45, mild BAE, mild MR, mild AI, mild TR, mod pulmonary HTN, RVSP 54 // Echo 6/18: EF 55-60, Gr 1 DD, mild AI, mild MR, mild LAE, mild to mod reduced RVSF, PASP 31    Legionnaire's disease ([place]) 1980s   had a thoracotomy   Nephrolithiasis    PAF (paroxysmal atrial fibrillation) ([place])    notes from [place] indicate prior Holter showed AF // patient was on Amiodarone + Apixaban for 2-3 years // patient stopped Amio due to rash in 2017 // pt stopped anticoag due to cost (Eliquis and Xarelto) // Holter in 11/15 (on Amio) neg for AFib    SLE (systemic lupus erythematosus) (HCC)    prior hx of long term prednisone use   SOB (shortness of breath)     Current Medications: Current Meds  Medication Sig   aspirin EC 81 MG tablet Take 81 mg by mouth daily.   atorvastatin (LIPITOR) 10 MG tablet Take 10 mg by mouth daily.   irbesartan (AVAPRO) 75 MG tablet Take 75 mg by mouth daily.   METOPROLOL SUCCINATE ER PO Take 25 mg by mouth daily.   omeprazole (PRILOSEC) 20 MG capsule Take 20 mg by mouth daily.   tamsulosin (FLOMAX) 0.4 MG CAPS capsule Take 0.4 mg by mouth daily.     Allergies:   Penicillins, Sulfa antibiotics, and Tamiflu [oseltamivir phosphate]  Social History   Tobacco Use   Smoking status: Former    Types: Cigarettes    Quit date: 1990    Years since quitting: 32.6   Smokeless tobacco: Never  Substance Use Topics   Alcohol use: No   Drug use: No     Family Hx: The patient's family history includes Heart disease in his brother, sister, and sister.  Review of Systems  Gastrointestinal:  Negative for hematochezia.  Genitourinary:  Negative for hematuria.    EKGs/Labs/Other Test Reviewed:    EKG:  EKG is ordered today.  The ekg ordered today demonstrates NSR, HR 96, left anterior fascicular block, right bundle branch block, QTC 502, QRS 144  Recent Labs: No results found for requested labs within last 8760 hours.   Recent Lipid Panel No results found for: CHOL,  TRIG, HDL, LDLCALC, LDLDIRECT    Risk Assessment/Calculations:    CHA2DS2-VASc Score = 4  This indicates a 4.8% annual risk of stroke. The patient's score is based upon: CHF History: No HTN History: Yes Diabetes History: No Stroke History: No Vascular Disease History: Yes Age Score: 2 Gender Score: 0        Physical Exam:    VS:  BP 130/70   Pulse 96   Ht 5' 8" (1.727 m)   Wt 155 lb 12.8 oz (70.7 kg)   SpO2 93%   BMI 23.69 kg/m     Wt Readings from Last 3 Encounters:  05/20/21 155 lb 12.8 oz (70.7 kg)  05/25/20 154 lb 3.2 oz (69.9 kg)  02/16/17 158 lb 6.4 oz (71.8 kg)     Constitutional:      Appearance: Healthy appearance. Not in distress.  Neck:     Vascular: No JVR.  Pulmonary:     Effort: Pulmonary effort is normal.     Breath sounds: No wheezing. No rales.  Cardiovascular:     Normal rate. Regular rhythm. Normal S1. Normal S2.      Murmurs: There is no murmur.  Edema:    Peripheral edema absent.  Abdominal:     Palpations: Abdomen is soft.  Skin:    General: Skin is warm and dry.  Neurological:     General: No focal deficit present.     Mental Status: Alert and oriented to person, place and time.     Cranial Nerves: Cranial nerves are intact.       ASSESSMENT & PLAN:    1. Coronary artery disease of bypass graft of native heart with stable angina pectoris [facility]) Status post myocardial infarction in 2010 followed by CABG.  He is doing well without anginal symptoms.  Continue aspirin 81 mg daily, atorvastatin 10 mg daily, metoprolol succinate 25 mg daily.  Arrange CMET, CBC.  2. Essential hypertension Blood pressures well controlled.  Continue irbesartan 75 mg daily, metoprolol succinate 25 mg daily.  3. Mixed hyperlipidemia Continue atorvastatin 10 mg daily.  Arrange CMET, fasting lipids.  4. Bifascicular block Heart rate is a little bit faster today than typical.  He has taken his medicine.  Given his history of bifascicular block, I do not  think we should increase his dose of metoprolol.  I will add a CBC into his blood work as noted above.     Dispo:  Return in about 1 year (around 05/20/2022) for Routine Follow Up with Dr. Tamala Julian.   Medication Adjustments/Labs and Tests Ordered: Current medicines are reviewed at length with the patient today.  Concerns regarding medicines are outlined  above.  Tests Ordered: Orders Placed This Encounter  Procedures   Comp Met (CMET)   Lipid Profile   CBC   EKG 12-Lead   Medication Changes: No orders of the defined types were placed in this encounter.   Signed, Richardson Dopp, PA-C  05/20/2021 11:25 AM    [facility] [place], [place], [place]  [address] Phone: [phone]; Fax: [phone]

## 2021-05-20 ENCOUNTER — Ambulatory Visit: Payer: Medicare HMO | Admitting: Physician Assistant

## 2021-05-20 ENCOUNTER — Other Ambulatory Visit: Payer: Self-pay

## 2021-05-20 ENCOUNTER — Encounter: Payer: Self-pay | Admitting: Physician Assistant

## 2021-05-20 VITALS — BP 130/70 | HR 96 | Ht 68.0 in | Wt 155.8 lb

## 2021-05-20 DIAGNOSIS — E782 Mixed hyperlipidemia: Secondary | ICD-10-CM | POA: Diagnosis not present

## 2021-05-20 DIAGNOSIS — I452 Bifascicular block: Secondary | ICD-10-CM

## 2021-05-20 DIAGNOSIS — I480 Paroxysmal atrial fibrillation: Secondary | ICD-10-CM

## 2021-05-20 DIAGNOSIS — I10 Essential (primary) hypertension: Secondary | ICD-10-CM | POA: Diagnosis not present

## 2021-05-20 DIAGNOSIS — I25708 Atherosclerosis of coronary artery bypass graft(s), unspecified, with other forms of angina pectoris: Secondary | ICD-10-CM

## 2021-05-20 NOTE — Patient Instructions (Signed)
Medication Instructions:  Your physician recommends that you continue on your current medications as directed. Please refer to the Current Medication list given to you today.  *If you need a refill on your cardiac medications before your next appointment, please call your pharmacy*   Lab Work: Your physician recommends that you return for a FASTING lipid profile/cmet/cbc on September 13 between 7:30-4:30 fasting from midnight the night before.   If you have labs (blood work) drawn today and your tests are completely normal, you will receive your results only by: [place] (if you have MyChart) OR A paper copy in the mail If you have any lab test that is abnormal or we need to change your treatment, we will call you to review the results.   Testing/Procedures: -None  Follow-Up: At [facility], you and your health needs are our priority.  As part of our continuing mission to provide you with exceptional heart care, we have created designated Provider Care Teams.  These Care Teams include your primary Cardiologist (physician) and Advanced Practice Providers (APPs -  Physician Assistants and Nurse Practitioners) who all work together to provide you with the care you need, when you need it.  We recommend signing up for the patient portal called "MyChart".  Sign up information is provided on this After Visit Summary.  MyChart is used to connect with patients for Virtual Visits (Telemedicine).  Patients are able to view lab/test results, encounter notes, upcoming appointments, etc.  Non-urgent messages can be sent to your provider as well.   To learn more about what you can do with MyChart, go to [email or web address].    Your next appointment:   1 year(s)  The format for your next appointment:   In Person  Provider:   Daneen Schick, MD   Other Instructions Your physician wants you to follow-up in: 1 year with Dr.Smith.  You will receive a reminder letter in the mail two months  in advance. If you don't receive a letter, please call our office to schedule the follow-up appointment.

## 2021-05-31 ENCOUNTER — Other Ambulatory Visit: Payer: Self-pay

## 2021-05-31 ENCOUNTER — Other Ambulatory Visit: Payer: Medicare HMO | Admitting: *Deleted

## 2021-05-31 DIAGNOSIS — E782 Mixed hyperlipidemia: Secondary | ICD-10-CM | POA: Diagnosis not present

## 2021-05-31 DIAGNOSIS — I10 Essential (primary) hypertension: Secondary | ICD-10-CM

## 2021-05-31 DIAGNOSIS — I25708 Atherosclerosis of coronary artery bypass graft(s), unspecified, with other forms of angina pectoris: Secondary | ICD-10-CM | POA: Diagnosis not present

## 2021-05-31 LAB — CBC
Hematocrit: 43 % (ref 37.5–51.0)
Hemoglobin: 14.9 g/dL (ref 13.0–17.7)
MCH: 32.2 pg (ref 26.6–33.0)
MCHC: 34.7 g/dL (ref 31.5–35.7)
MCV: 93 fL (ref 79–97)
Platelets: 177 10*3/uL (ref 150–450)
RBC: 4.63 x10E6/uL (ref 4.14–5.80)
RDW: 12.2 % (ref 11.6–15.4)
WBC: 6.3 10*3/uL (ref 3.4–10.8)

## 2021-05-31 LAB — COMPREHENSIVE METABOLIC PANEL
ALT: 10 IU/L (ref 0–44)
AST: 20 IU/L (ref 0–40)
Albumin/Globulin Ratio: 1.2 (ref 1.2–2.2)
Albumin: 4.1 g/dL (ref 3.6–4.6)
Alkaline Phosphatase: 95 IU/L (ref 44–121)
BUN/Creatinine Ratio: 14 (ref 10–24)
BUN: 19 mg/dL (ref 8–27)
Bilirubin Total: 0.8 mg/dL (ref 0.0–1.2)
CO2: 21 mmol/L (ref 20–29)
Calcium: 9.2 mg/dL (ref 8.6–10.2)
Chloride: 106 mmol/L (ref 96–106)
Creatinine, Ser: 1.33 mg/dL — ABNORMAL HIGH (ref 0.76–1.27)
Globulin, Total: 3.4 g/dL (ref 1.5–4.5)
Glucose: 79 mg/dL (ref 65–99)
Potassium: 4.1 mmol/L (ref 3.5–5.2)
Sodium: 141 mmol/L (ref 134–144)
Total Protein: 7.5 g/dL (ref 6.0–8.5)
eGFR: 53 mL/min/{1.73_m2} — ABNORMAL LOW (ref >59)

## 2021-05-31 LAB — LIPID PANEL
Chol/HDL Ratio: 3.9 ratio (ref 0.0–5.0)
Cholesterol, Total: 125 mg/dL (ref 100–199)
HDL: 32 mg/dL — ABNORMAL LOW (ref >39)
LDL Chol Calc (NIH): 71 mg/dL (ref 0–99)
Triglycerides: 119 mg/dL (ref 0–149)
VLDL Cholesterol Cal: 22 mg/dL (ref 5–40)

## 2021-06-03 ENCOUNTER — Other Ambulatory Visit: Payer: Self-pay | Admitting: *Deleted

## 2021-06-03 DIAGNOSIS — I25708 Atherosclerosis of coronary artery bypass graft(s), unspecified, with other forms of angina pectoris: Secondary | ICD-10-CM

## 2021-06-03 DIAGNOSIS — E782 Mixed hyperlipidemia: Secondary | ICD-10-CM

## 2021-06-03 MED ORDER — ATORVASTATIN CALCIUM 20 MG PO TABS: 20.0000 mg | ORAL_TABLET | Freq: Every day | ORAL | 3 refills | Status: DC

## 2021-08-04 DIAGNOSIS — N401 Enlarged prostate with lower urinary tract symptoms: Secondary | ICD-10-CM | POA: Diagnosis not present

## 2021-08-04 DIAGNOSIS — I10 Essential (primary) hypertension: Secondary | ICD-10-CM | POA: Diagnosis not present

## 2021-08-04 DIAGNOSIS — I25708 Atherosclerosis of coronary artery bypass graft(s), unspecified, with other forms of angina pectoris: Secondary | ICD-10-CM | POA: Diagnosis not present

## 2021-08-04 DIAGNOSIS — E7800 Pure hypercholesterolemia, unspecified: Secondary | ICD-10-CM | POA: Diagnosis not present

## 2021-08-04 DIAGNOSIS — Z23 Encounter for immunization: Secondary | ICD-10-CM | POA: Diagnosis not present

## 2021-08-04 DIAGNOSIS — Z79899 Other long term (current) drug therapy: Secondary | ICD-10-CM | POA: Diagnosis not present

## 2021-08-13 DIAGNOSIS — R059 Cough, unspecified: Secondary | ICD-10-CM | POA: Diagnosis not present

## 2021-08-13 DIAGNOSIS — J069 Acute upper respiratory infection, unspecified: Secondary | ICD-10-CM | POA: Diagnosis not present

## 2021-08-13 DIAGNOSIS — R5383 Other fatigue: Secondary | ICD-10-CM | POA: Diagnosis not present

## 2021-08-13 DIAGNOSIS — U071 COVID-19: Secondary | ICD-10-CM | POA: Diagnosis not present

## 2021-08-31 ENCOUNTER — Other Ambulatory Visit: Payer: Medicare HMO

## 2022-02-17 DIAGNOSIS — M62830 Muscle spasm of back: Secondary | ICD-10-CM | POA: Diagnosis not present

## 2022-02-17 DIAGNOSIS — M545 Low back pain, unspecified: Secondary | ICD-10-CM | POA: Diagnosis not present

## 2022-03-01 DIAGNOSIS — N200 Calculus of kidney: Secondary | ICD-10-CM | POA: Diagnosis not present

## 2022-03-01 DIAGNOSIS — R109 Unspecified abdominal pain: Secondary | ICD-10-CM | POA: Diagnosis not present

## 2022-03-01 DIAGNOSIS — N183 Chronic kidney disease, stage 3 unspecified: Secondary | ICD-10-CM | POA: Diagnosis not present

## 2022-03-01 DIAGNOSIS — M545 Low back pain, unspecified: Secondary | ICD-10-CM | POA: Diagnosis not present

## 2022-03-01 DIAGNOSIS — Z87442 Personal history of urinary calculi: Secondary | ICD-10-CM | POA: Diagnosis not present

## 2022-03-01 DIAGNOSIS — N289 Disorder of kidney and ureter, unspecified: Secondary | ICD-10-CM | POA: Diagnosis not present

## 2022-03-02 ENCOUNTER — Other Ambulatory Visit: Payer: Self-pay | Admitting: Family Medicine

## 2022-03-02 DIAGNOSIS — R109 Unspecified abdominal pain: Secondary | ICD-10-CM

## 2022-03-02 DIAGNOSIS — Z87442 Personal history of urinary calculi: Secondary | ICD-10-CM

## 2022-03-02 DIAGNOSIS — M545 Low back pain, unspecified: Secondary | ICD-10-CM

## 2022-03-07 ENCOUNTER — Ambulatory Visit
Admission: RE | Admit: 2022-03-07 | Discharge: 2022-03-07 | Disposition: A | Discharge status: Home / Self Care | Payer: Medicare HMO | Source: Ambulatory Visit | Attending: Family Medicine | Admitting: Family Medicine

## 2022-03-07 DIAGNOSIS — R109 Unspecified abdominal pain: Secondary | ICD-10-CM

## 2022-03-07 DIAGNOSIS — Z87442 Personal history of urinary calculi: Secondary | ICD-10-CM

## 2022-03-07 DIAGNOSIS — K5900 Constipation, unspecified: Secondary | ICD-10-CM | POA: Diagnosis not present

## 2022-03-07 DIAGNOSIS — M545 Low back pain, unspecified: Secondary | ICD-10-CM

## 2022-03-10 DIAGNOSIS — R1032 Left lower quadrant pain: Secondary | ICD-10-CM | POA: Diagnosis not present

## 2022-03-10 DIAGNOSIS — I10 Essential (primary) hypertension: Secondary | ICD-10-CM | POA: Diagnosis not present

## 2022-03-10 DIAGNOSIS — M8700 Idiopathic aseptic necrosis of unspecified bone: Secondary | ICD-10-CM | POA: Diagnosis not present

## 2022-03-10 DIAGNOSIS — I77810 Thoracic aortic ectasia: Secondary | ICD-10-CM | POA: Diagnosis not present

## 2022-03-23 DIAGNOSIS — N200 Calculus of kidney: Secondary | ICD-10-CM | POA: Diagnosis not present

## 2022-03-23 DIAGNOSIS — D49512 Neoplasm of unspecified behavior of left kidney: Secondary | ICD-10-CM | POA: Diagnosis not present

## 2022-03-24 ENCOUNTER — Other Ambulatory Visit: Payer: Self-pay | Admitting: Urology

## 2022-03-24 DIAGNOSIS — M545 Low back pain, unspecified: Secondary | ICD-10-CM | POA: Diagnosis not present

## 2022-03-24 DIAGNOSIS — D3012 Benign neoplasm of left renal pelvis: Secondary | ICD-10-CM

## 2022-03-25 ENCOUNTER — Emergency Department (HOSPITAL_COMMUNITY): Payer: Medicare HMO

## 2022-03-25 ENCOUNTER — Encounter (HOSPITAL_COMMUNITY): Payer: Self-pay | Admitting: Emergency Medicine

## 2022-03-25 ENCOUNTER — Other Ambulatory Visit: Payer: Self-pay

## 2022-03-25 ENCOUNTER — Emergency Department (HOSPITAL_COMMUNITY)
Admission: EM | Admit: 2022-03-25 | Discharge: 2022-03-25 | Disposition: A | Discharge status: Home / Self Care | Payer: Medicare HMO | Attending: Emergency Medicine | Admitting: Emergency Medicine

## 2022-03-25 DIAGNOSIS — S7291XA Unspecified fracture of right femur, initial encounter for closed fracture: Secondary | ICD-10-CM | POA: Diagnosis not present

## 2022-03-25 DIAGNOSIS — Z951 Presence of aortocoronary bypass graft: Secondary | ICD-10-CM | POA: Insufficient documentation

## 2022-03-25 DIAGNOSIS — I2510 Atherosclerotic heart disease of native coronary artery without angina pectoris: Secondary | ICD-10-CM | POA: Insufficient documentation

## 2022-03-25 DIAGNOSIS — I10 Essential (primary) hypertension: Secondary | ICD-10-CM | POA: Diagnosis not present

## 2022-03-25 DIAGNOSIS — S79921A Unspecified injury of right thigh, initial encounter: Secondary | ICD-10-CM | POA: Diagnosis present

## 2022-03-25 DIAGNOSIS — S72001A Fracture of unspecified part of neck of right femur, initial encounter for closed fracture: Secondary | ICD-10-CM

## 2022-03-25 DIAGNOSIS — Z7982 Long term (current) use of aspirin: Secondary | ICD-10-CM | POA: Insufficient documentation

## 2022-03-25 DIAGNOSIS — M25551 Pain in right hip: Secondary | ICD-10-CM | POA: Diagnosis not present

## 2022-03-25 DIAGNOSIS — Z79899 Other long term (current) drug therapy: Secondary | ICD-10-CM | POA: Diagnosis not present

## 2022-03-25 DIAGNOSIS — I480 Paroxysmal atrial fibrillation: Secondary | ICD-10-CM | POA: Insufficient documentation

## 2022-03-25 DIAGNOSIS — W19XXXA Unspecified fall, initial encounter: Secondary | ICD-10-CM | POA: Diagnosis not present

## 2022-03-25 DIAGNOSIS — Y92000 Kitchen of unspecified non-institutional (private) residence as  the place of occurrence of the external cause: Secondary | ICD-10-CM | POA: Insufficient documentation

## 2022-03-25 LAB — CBC
HCT: 44.8 % (ref 39.0–52.0)
Hemoglobin: 14.7 g/dL (ref 13.0–17.0)
MCH: 32.4 pg (ref 26.0–34.0)
MCHC: 32.8 g/dL (ref 30.0–36.0)
MCV: 98.7 fL (ref 80.0–100.0)
Platelets: 171 10*3/uL (ref 150–400)
RBC: 4.54 MIL/uL (ref 4.22–5.81)
RDW: 13.2 % (ref 11.5–15.5)
WBC: 7.1 10*3/uL (ref 4.0–10.5)
nRBC: 0 % (ref 0.0–0.2)

## 2022-03-25 LAB — BASIC METABOLIC PANEL
Anion gap: 9 (ref 5–15)
BUN: 19 mg/dL (ref 8–23)
CO2: 21 mmol/L — ABNORMAL LOW (ref 22–32)
Calcium: 9.4 mg/dL (ref 8.9–10.3)
Chloride: 113 mmol/L — ABNORMAL HIGH (ref 98–111)
Creatinine, Ser: 1.22 mg/dL (ref 0.61–1.24)
GFR, Estimated: 59 mL/min — ABNORMAL LOW (ref >60)
Glucose, Bld: 89 mg/dL (ref 70–99)
Potassium: 4.3 mmol/L (ref 3.5–5.1)
Sodium: 143 mmol/L (ref 135–145)

## 2022-03-25 MED ORDER — HYDROCODONE-ACETAMINOPHEN 5-325 MG PO TABS
1.0000 | ORAL_TABLET | Freq: Once | ORAL | Status: AC
  Administered 2022-03-25: 1 via ORAL
  Filled 2022-03-25: qty 1

## 2022-03-25 MED ORDER — ONDANSETRON HCL 4 MG/2ML IJ SOLN
4.0000 mg | Freq: Once | INTRAMUSCULAR | Status: AC
  Administered 2022-03-25: 4 mg via INTRAVENOUS
  Filled 2022-03-25: qty 2

## 2022-03-25 MED ORDER — MORPHINE SULFATE (PF) 4 MG/ML IV SOLN
4.0000 mg | Freq: Once | INTRAVENOUS | Status: AC
  Administered 2022-03-25: 4 mg via INTRAVENOUS
  Filled 2022-03-25: qty 1

## 2022-03-25 MED ORDER — HYDROCODONE-ACETAMINOPHEN 5-325 MG PO TABS: 1.0000 | ORAL_TABLET | Freq: Four times a day (QID) | ORAL | 0 refills | Status: DC | PRN

## 2022-03-25 NOTE — ED Notes (Signed)
ED Provider at bedside. 

## 2022-03-25 NOTE — Discharge Instructions (Addendum)
You can bear weight as tolerated.  If you are taking the pain medication regularly you should use a stool softener or laxative so you do not get constipated.  Home health order was placed and someone should contact you by Monday.  Take is easy over the next week.  Use your walker or wheelchair as needed if it is too painful to walk without it

## 2022-03-25 NOTE — ED Provider Notes (Signed)
[place] DEPT Provider Note   CSN: [id number] Arrival date & time: 03/25/22  0907     History  Chief Complaint  Patient presents with   Hip Pain    Johnny Morgan is a 81 y.o. male.  Patient is an 81 year old male with a history of lupus, CAD status post CABG, paroxysmal atrial fibrillation who is not on anticoagulation, hypertension, ischemic cardiomyopathy with an EF of 55 to 60% who is presenting today with severe right hip pain after a fall at 3 AM this morning.  Patient reports that he cares for his wife who has dementia and he gets up in the middle of the night to change her so that she does not have to sit in a wet diaper and he tripped over the comforter which caused him to twist and landed directly on the right hip.  He denies hitting his head or any loss of consciousness.  He was able to crawl about 20 minutes after he fell into the kitchen and then used his wife's walker to get to the bed but has severe pain with any weightbearing.  He denies any numbness or tingling of the foot.  He denies any injury to the upper body, chest pain, shortness of breath or abdominal pain.  He does report that he had a hip replacement done approximately 23 years ago in [place] and has not had any issues with the replacement since that time.  He did take some Tylenol at home with only minimal improvement.  Any type of movement seems to really cause increased pain.  The history is provided by the patient.  Hip Pain This is a new problem.       Home Medications Prior to Admission medications   Medication Sig Start Date End Date Taking? Authorizing Provider  HYDROcodone-acetaminophen (NORCO/VICODIN) 5-325 MG tablet Take 1 tablet by mouth every 6 (six) hours as needed for severe pain. 03/25/22  Yes Blanchie Dessert, MD  aspirin EC 81 MG tablet Take 81 mg by mouth daily.    [provider]  atorvastatin (LIPITOR) 20 MG tablet Take 1 tablet (20 mg total)  by mouth daily. 06/03/21 06/03/22  Richardson Dopp T, PA-C  irbesartan (AVAPRO) 75 MG tablet Take 75 mg by mouth daily.    [provider]  METOPROLOL SUCCINATE ER PO Take 25 mg by mouth daily.    [provider]  omeprazole (PRILOSEC) 20 MG capsule Take 20 mg by mouth daily.    [provider]  tamsulosin (FLOMAX) 0.4 MG CAPS capsule Take 0.4 mg by mouth daily. 05/22/20   [provider]      Allergies    Penicillins, Sulfa antibiotics, and Tamiflu [oseltamivir phosphate]    Review of Systems   Review of Systems  Physical Exam Updated Vital Signs BP (!) 188/97   Pulse (!) 52   Temp 97.6 F (36.4 C) (Oral)   Resp 19   Ht '5\' 8"'$  (1.727 m)   Wt 67.1 kg   SpO2 92%   BMI 22.50 kg/m  Physical Exam Vitals and nursing note reviewed.  Constitutional:      General: He is not in acute distress.    Appearance: He is well-developed.  HENT:     Head: Normocephalic and atraumatic.  Eyes:     Conjunctiva/sclera: Conjunctivae normal.     Pupils: Pupils are equal, round, and reactive to light.  Cardiovascular:     Rate and Rhythm: Normal rate and regular rhythm.  Heart sounds: No murmur heard.    Comments: Chest wall scar consistent with CABG. Pulmonary:     Effort: Pulmonary effort is normal. No respiratory distress.     Breath sounds: Normal breath sounds. No wheezing or rales.  Abdominal:     General: There is no distension.     Palpations: Abdomen is soft.     Tenderness: There is no abdominal tenderness. There is no guarding or rebound.  Musculoskeletal:        General: Tenderness present. Normal range of motion.     Cervical back: Normal range of motion and neck supple.     Right lower leg: No edema.     Left lower leg: No edema.     Comments: Tenderness with palpation to the right proximal femur.  No deformities.  Right knee is within normal limits.  The sensation of the right foot is normal.  Patient is able to range his ankle and wiggle his  toes without difficulty.  Skin:    General: Skin is warm and dry.     Findings: No erythema or rash.  Neurological:     Mental Status: He is alert and oriented to person, place, and time. Mental status is at baseline.  Psychiatric:        Mood and Affect: Mood normal.        Behavior: Behavior normal.     ED Results / Procedures / Treatments   Labs (all labs ordered are listed, but only abnormal results are displayed) Labs Reviewed  BASIC METABOLIC PANEL - Abnormal; Notable for the following components:      Result Value   Chloride 113 (*)    CO2 21 (*)    GFR, Estimated 59 (*)    All other components within normal limits  CBC    EKG None  Radiology DG Hip Unilat W or Wo Pelvis 2-3 Views Right  Result Date: 03/25/2022 CLINICAL DATA:  Fall with right hip pain. EXAM: DG HIP (WITH OR WITHOUT PELVIS) 2-3V RIGHT COMPARISON:  None Available. FINDINGS: There is a transverse impacted mildly comminuted fracture of the proximal right femur. Prior total right hip arthroplasty IMPRESSION: 1. Transverse impacted mildly comminuted fracture of the proximal right femur. 2. Prior total right hip arthroplasty appears intact. Electronically Signed   By: Fidela Salisbury M.D.   On: 03/25/2022 12:39    Procedures Procedures    Medications Ordered in ED Medications  morphine (PF) 4 MG/ML injection 4 mg (4 mg Intravenous Given 03/25/22 1349)  ondansetron (ZOFRAN) injection 4 mg (4 mg Intravenous Given 03/25/22 1348)  HYDROcodone-acetaminophen (NORCO/VICODIN) 5-325 MG per tablet 1 tablet (1 tablet Oral Given 03/25/22 1554)    ED Course/ Medical Decision Making/ A&P                           Medical Decision Making Amount and/or Complexity of Data Reviewed External Data Reviewed: notes.    Details: pcp Labs: ordered. Decision-making details documented in ED Course. Radiology: ordered and independent interpretation performed. Decision-making details documented in ED  Course.  Risk Prescription drug management.   Pt with multiple medical problems and comorbidities and presenting today with a complaint that caries a high risk for morbidity and mortality.  Presenting today with severe right hip pain after a fall that occurred at 3 AM this morning.  Patient is neurovascularly intact.  No head injury and does not take any anticoagulation other than a baby aspirin.  Patient is noted to be hypertensive here but otherwise vital signs are reassuring.  Patient is in significant pain and was given pain control.  I independently interpreted patient's labs today and CBC and BMP without acute findings. I have independently visualized and interpreted pt's images today.  And right hip image shows an intact prosthesis with proximal right femur fracture.  Radiology reports a transverse impacted mildly comminuted fracture of the proximal right femur but no other acute findings.  A consult was placed to orthopedics and spoke with Dr. Mardelle Matte.  He evaluated the films and at this time patient does not require any surgery.  He can be weightbearing as tolerated and follow-up in 1 to 2 weeks.  Findings discussed with the patient and his son who are at bedside.  He is motivated to go home and will attempt to ambulate the patient after he receives pain control.  Patient does have a walker and wheelchair at home that he can use if needed.  Does not appear to have any social barriers affecting his discharge today.  He will be discharged with some pain medication.  3:58 PM Pt pain improved after morphine.  He was able to stand with little to no assistance and use a walker.  Pt is ready for d/c home.  Given ppx for pain control and f/u with Dr. Mardelle Matte        Final Clinical Impression(s) / ED Diagnoses Final diagnoses:  Fracture, proximal femur, right, closed, initial encounter [facility])    Rx / DC Orders ED Discharge Orders          Ordered    HYDROcodone-acetaminophen (NORCO/VICODIN)  5-325 MG tablet  Every 6 hours PRN        03/25/22 [place]        03/25/22 1557    Face-to-face encounter (required for Medicare/Medicaid patients)       Comments: I Blanchie Dessert certify that this patient is under my care and that I, or a nurse practitioner or physician's assistant working with me, had a face-to-face encounter that meets the physician face-to-face encounter requirements with this patient on 03/25/2022. The encounter with the patient was in whole, or in part for the following medical condition(s) which is the primary reason for home health care (List medical condition): all questions can be sent to pt's PCP.  Pt also has elderly demented wife he cares for but now with leg fracture will not be able ot care for her   03/25/22 1557              Blanchie Dessert, MD 03/25/22 1558

## 2022-03-25 NOTE — ED Triage Notes (Signed)
Patient reports falling at 3 am today, has right hip pain, unable to bear weight, decreased rom, had right hip replacement 23 years ago in [place]. Pain rated 10/10. Patient lost balance trying to take care of his wife with dementia.

## 2022-03-25 NOTE — Progress Notes (Signed)
Reviewed x-rays, discussed case with ER provider.  Patient with a right periprosthetic femur fracture, involving the greater trochanter.  The stem appears stable.  He can be weightbearing as tolerated, or touch toe weightbearing if capable, and work with physical therapy, crutches or a walker, and can be discharged home if possible.  If not he may need medical admission and placement.  We will continue to follow while he will is in-house if he ends up needing to be admitted.  Otherwise he can follow-up with me in 1 to 2 weeks.  No surgery indicated.  Marchia Bond, MD

## 2022-03-25 NOTE — ED Provider Triage Note (Signed)
Emergency Medicine Provider Triage Evaluation Note  Johnny Morgan , a 81 y.o. male  was evaluated in triage.  Pt complains of fall, right hip pain. States that same occurred around 3 am this morning when he got up to help his wife who has dementia. States that he tripped and fell directly on his right hip and has been unable to walk since then. Denies hitting his head or LOC. States that he had the same hip replaced 23 years ago in [place]. No blood thinners  Review of Systems  Positive:  Negative:   Physical Exam  BP (!) 159/108 (BP Location: Left Arm)   Pulse 75   Temp 97.8 F (36.6 C) (Oral)   Resp 18   Ht '5\' 8"'$  (1.727 m)   Wt 67.1 kg   SpO2 96%   BMI 22.50 kg/m  Gen:   Awake, no distress   Resp:  Normal effort  MSK:   Moves extremities without difficulty  Other:  DP and PT pulses intact and 2+  Medical Decision Making  Medically screening exam initiated at 11:43 AM.  Appropriate orders placed.  Johnny Morgan was informed that the remainder of the evaluation will be completed by another provider, this initial triage assessment does not replace that evaluation, and the importance of remaining in the ED until their evaluation is complete.     Bud Face, PA-C 03/25/22 1146

## 2022-04-05 ENCOUNTER — Ambulatory Visit
Admission: RE | Admit: 2022-04-05 | Discharge: 2022-04-05 | Disposition: A | Discharge status: Home / Self Care | Payer: Medicare HMO | Source: Ambulatory Visit | Attending: Urology | Admitting: Urology

## 2022-04-05 DIAGNOSIS — N261 Atrophy of kidney (terminal): Secondary | ICD-10-CM | POA: Diagnosis not present

## 2022-04-05 DIAGNOSIS — K5730 Diverticulosis of large intestine without perforation or abscess without bleeding: Secondary | ICD-10-CM | POA: Diagnosis not present

## 2022-04-05 DIAGNOSIS — N2889 Other specified disorders of kidney and ureter: Secondary | ICD-10-CM | POA: Diagnosis not present

## 2022-04-05 DIAGNOSIS — N281 Cyst of kidney, acquired: Secondary | ICD-10-CM | POA: Diagnosis not present

## 2022-04-05 DIAGNOSIS — D3012 Benign neoplasm of left renal pelvis: Secondary | ICD-10-CM

## 2022-04-05 MED ORDER — GADOBENATE DIMEGLUMINE 529 MG/ML IV SOLN
13.0000 mL | Freq: Once | INTRAVENOUS | Status: AC | PRN
  Administered 2022-04-05: 13 mL via INTRAVENOUS

## 2022-04-12 ENCOUNTER — Other Ambulatory Visit: Payer: Self-pay | Admitting: Urology

## 2022-04-12 DIAGNOSIS — D49512 Neoplasm of unspecified behavior of left kidney: Secondary | ICD-10-CM

## 2022-04-13 DIAGNOSIS — M25551 Pain in right hip: Secondary | ICD-10-CM | POA: Diagnosis not present

## 2022-04-13 DIAGNOSIS — M545 Low back pain, unspecified: Secondary | ICD-10-CM | POA: Diagnosis not present

## 2022-04-24 ENCOUNTER — Emergency Department (HOSPITAL_BASED_OUTPATIENT_CLINIC_OR_DEPARTMENT_OTHER)
Admission: EM | Admit: 2022-04-24 | Discharge: 2022-04-24 | Disposition: A | Discharge status: Home / Self Care | Payer: Medicare HMO | Attending: Emergency Medicine | Admitting: Emergency Medicine

## 2022-04-24 ENCOUNTER — Encounter (HOSPITAL_BASED_OUTPATIENT_CLINIC_OR_DEPARTMENT_OTHER): Payer: Self-pay

## 2022-04-24 ENCOUNTER — Other Ambulatory Visit: Payer: Self-pay

## 2022-04-24 ENCOUNTER — Emergency Department (HOSPITAL_BASED_OUTPATIENT_CLINIC_OR_DEPARTMENT_OTHER): Payer: Medicare HMO

## 2022-04-24 DIAGNOSIS — R109 Unspecified abdominal pain: Secondary | ICD-10-CM | POA: Diagnosis not present

## 2022-04-24 DIAGNOSIS — R079 Chest pain, unspecified: Secondary | ICD-10-CM | POA: Diagnosis not present

## 2022-04-24 DIAGNOSIS — I723 Aneurysm of iliac artery: Secondary | ICD-10-CM | POA: Diagnosis not present

## 2022-04-24 DIAGNOSIS — Z7982 Long term (current) use of aspirin: Secondary | ICD-10-CM | POA: Diagnosis not present

## 2022-04-24 LAB — BASIC METABOLIC PANEL
Anion gap: 12 (ref 5–15)
BUN: 26 mg/dL — ABNORMAL HIGH (ref 8–23)
CO2: 22 mmol/L (ref 22–32)
Calcium: 9.5 mg/dL (ref 8.9–10.3)
Chloride: 108 mmol/L (ref 98–111)
Creatinine, Ser: 1.28 mg/dL — ABNORMAL HIGH (ref 0.61–1.24)
GFR, Estimated: 56 mL/min — ABNORMAL LOW (ref >60)
Glucose, Bld: 90 mg/dL (ref 70–99)
Potassium: 4.3 mmol/L (ref 3.5–5.1)
Sodium: 142 mmol/L (ref 135–145)

## 2022-04-24 LAB — CBC
HCT: 43.3 % (ref 39.0–52.0)
Hemoglobin: 14.4 g/dL (ref 13.0–17.0)
MCH: 32.8 pg (ref 26.0–34.0)
MCHC: 33.3 g/dL (ref 30.0–36.0)
MCV: 98.6 fL (ref 80.0–100.0)
Platelets: 169 10*3/uL (ref 150–400)
RBC: 4.39 MIL/uL (ref 4.22–5.81)
RDW: 13.5 % (ref 11.5–15.5)
WBC: 8.2 10*3/uL (ref 4.0–10.5)
nRBC: 0 % (ref 0.0–0.2)

## 2022-04-24 LAB — D-DIMER, QUANTITATIVE: D-Dimer, Quant: 1.82 ug/mL-FEU — ABNORMAL HIGH (ref 0.00–0.50)

## 2022-04-24 LAB — URINALYSIS, ROUTINE W REFLEX MICROSCOPIC
Bilirubin Urine: NEGATIVE
Glucose, UA: NEGATIVE mg/dL
Hgb urine dipstick: NEGATIVE
Ketones, ur: NEGATIVE mg/dL
Leukocytes,Ua: NEGATIVE
Nitrite: NEGATIVE
Protein, ur: 30 mg/dL — AB
Specific Gravity, Urine: 1.025 (ref 1.005–1.030)
pH: 5.5 (ref 5.0–8.0)

## 2022-04-24 LAB — TROPONIN I (HIGH SENSITIVITY)
Troponin I (High Sensitivity): 7 ng/L (ref <18)
Troponin I (High Sensitivity): 9 ng/L (ref <18)

## 2022-04-24 MED ORDER — IOHEXOL 350 MG/ML SOLN
100.0000 mL | Freq: Once | INTRAVENOUS | Status: AC | PRN
  Administered 2022-04-24: 100 mL via INTRAVENOUS

## 2022-04-24 MED ORDER — HYDROCODONE-ACETAMINOPHEN 5-325 MG PO TABS: 1.0000 | ORAL_TABLET | Freq: Four times a day (QID) | ORAL | 0 refills | Status: AC | PRN

## 2022-04-24 NOTE — ED Provider Notes (Signed)
[facility] DEPT Provider Note   CSN: [id number] Arrival date & time: 04/24/22  1609     History  Chief Complaint  Patient presents with   Flank Pain    Johnny Morgan is a 81 y.o. male.  Pt complains of pain in the left flank area.  Pt reports pain began today.  Pt describes severe pain.  No injury    The history is provided by the patient. No language interpreter was used.  Flank Pain This is a new problem. The problem occurs constantly. The problem has not changed since onset.Nothing aggravates the symptoms. Nothing relieves the symptoms. He has tried nothing for the symptoms. The treatment provided no relief.       Home Medications Prior to Admission medications   Medication Sig Start Date End Date Taking? Authorizing Provider  aspirin EC 81 MG tablet Take 81 mg by mouth daily.    [provider]  atorvastatin (LIPITOR) 20 MG tablet Take 1 tablet (20 mg total) by mouth daily. 06/03/21 06/03/22  Richardson Dopp T, PA-C  HYDROcodone-acetaminophen (NORCO/VICODIN) 5-325 MG tablet Take 1 tablet by mouth every 6 (six) hours as needed for severe pain. 03/25/22   Blanchie Dessert, MD  irbesartan (AVAPRO) 75 MG tablet Take 75 mg by mouth daily.    [provider]  METOPROLOL SUCCINATE ER PO Take 25 mg by mouth daily.    [provider]  omeprazole (PRILOSEC) 20 MG capsule Take 20 mg by mouth daily.    [provider]  tamsulosin (FLOMAX) 0.4 MG CAPS capsule Take 0.4 mg by mouth daily. 05/22/20   [provider]      Allergies    Penicillins, Sulfa antibiotics, and Tamiflu [oseltamivir phosphate]    Review of Systems   Review of Systems  Gastrointestinal:  Positive for nausea.  Genitourinary:  Positive for flank pain.  All other systems reviewed and are negative.   Physical Exam Updated Vital Signs BP (!) 175/90   Pulse 77   Temp 98 F (36.7 C)   Resp (!) 28   Ht '5\' 8"'$  (1.727 m)   Wt 67.1 kg   SpO2 90%    BMI 22.49 kg/m  Physical Exam Vitals and nursing note reviewed.  Constitutional:      Appearance: He is well-developed.  HENT:     Head: Normocephalic.     Mouth/Throat:     Mouth: Mucous membranes are moist.  Eyes:     Extraocular Movements: Extraocular movements intact.     Pupils: Pupils are equal, round, and reactive to light.  Cardiovascular:     Rate and Rhythm: Normal rate.  Pulmonary:     Effort: Pulmonary effort is normal.  Abdominal:     General: Abdomen is flat. There is no distension.  Musculoskeletal:        General: Normal range of motion.     Cervical back: Normal range of motion.  Skin:    General: Skin is warm.  Neurological:     General: No focal deficit present.     Mental Status: He is alert and oriented to person, place, and time.  Psychiatric:        Mood and Affect: Mood normal.     ED Results / Procedures / Treatments   Labs (all labs ordered are listed, but only abnormal results are displayed) Labs Reviewed  URINALYSIS, ROUTINE W REFLEX MICROSCOPIC - Abnormal; Notable for the following components:      Result Value  Protein, ur 30 (*)    All other components within normal limits  BASIC METABOLIC PANEL - Abnormal; Notable for the following components:   BUN 26 (*)    Creatinine, Ser 1.28 (*)    GFR, Estimated 56 (*)    All other components within normal limits  D-DIMER, QUANTITATIVE (NOT AT [facility]) - Abnormal; Notable for the following components:   D-Dimer, Quant 1.82 (*)    All other components within normal limits  CBC  TROPONIN I (HIGH SENSITIVITY)  TROPONIN I (HIGH SENSITIVITY)    EKG None  Radiology CT Angio Chest/Abd/Pel for Dissection W and/or Wo Contrast  Result Date: 04/24/2022 CLINICAL DATA:  Chest pain or back pain, aortic dissection suspected. Left flank pain. EXAM: CT ANGIOGRAPHY CHEST, ABDOMEN AND PELVIS TECHNIQUE: Non-contrast CT of the chest was initially obtained. Multidetector CT imaging through the chest, abdomen and  pelvis was performed using the standard protocol during bolus administration of intravenous contrast. Multiplanar reconstructed images and MIPs were obtained and reviewed to evaluate the vascular anatomy. RADIATION DOSE REDUCTION: This exam was performed according to the departmental dose-optimization program which includes automated exposure control, adjustment of the mA and/or kV according to patient size and/or use of iterative reconstruction technique. 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 This is stable. No aortic dissection. Diffuse aortic atherosclerosis. Moderate coronary artery calcifications. Heart is normal size. Mediastinum/Nodes: Numerous calcified mediastinal and bilateral hilar lymph nodes, stable compatible with old granulomatous disease. Trachea and esophagus are unremarkable. Thyroid unremarkable. Lungs/Pleura: Centrilobular and paraseptal emphysema. Extensive calcified granulomas throughout the lungs. Airspace opacity in the right lower lobe. No effusions. Musculoskeletal: Chest wall soft tissues are unremarkable. No acute bony abnormality. Review of the MIP images confirms the above findings. CTA ABDOMEN AND PELVIS FINDINGS VASCULAR Aorta: Diffuse aortic atherosclerosis. Tortuous aorta. No evidence of aneurysm or dissection. Celiac: Patent. 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 Widely patent left renal artery. IMA: Patent Inflow: Advanced atherosclerosis and tortuosity. 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 No dissection. Veins: No obvious venous abnormality within the limitations of this arterial phase study. Review of the MIP images confirms the above findings.  NON-VASCULAR Hepatobiliary: No focal liver abnormality is seen. Status post cholecystectomy. No biliary dilatation. Pancreas: No focal abnormality or ductal dilatation. Spleen: No focal abnormality.  Normal size. 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 No hydronephrosis. Concern for small enhancing lesion in the lower pole of the left kidney measuring 11 mm. Small cysts elsewhere in the left kidney. Adrenal glands and urinary bladder unremarkable. Stomach/Bowel: Colonic diverticulosis. No active diverticulitis. Stomach and small bowel decompressed, unremarkable. Lymphatic: No adenopathy Reproductive: Prostate enlargement Other: No free fluid or free air. Musculoskeletal: Prior right hip replacement. No acute bony abnormality. Review of the MIP images confirms the above findings. IMPRESSION: No evidence of aortic dissection. 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 Recommend annual imaging followup by CTA or MRA. This recommendation follows 2010 ACCF/AHA/AATS/ACR/ASA/SCA/SCAI/SIR/STS/SVM Guidelines for the Diagnosis and Management of Patients with Thoracic Aortic Disease. Circulation. 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 Concern for small enhancing  lesion in the lower pole of the left kidney measuring up to 11 mm. Recommend further evaluation with nonemergent outpatient MRI when patient is able to hold breath. Emphysema. Old granulomatous disease. Colonic diverticulosis. Electronically Signed   By: Rolm Baptise M.D.   On: 04/24/2022 19:14    Procedures Procedures    Medications Ordered in ED Medications  iohexol (OMNIPAQUE) 350 MG/ML injection 100 mL (100 mLs Intravenous Contrast Given 04/24/22 1844)    ED Course/ Medical Decision Making/ A&P                           Medical  Decision Making Pt complains of pain in his left flank back area   Amount and/or Complexity of Data Reviewed Labs: ordered. Decision-making details documented in ED Course. Radiology: ordered and independent interpretation performed. Decision-making details documented in ED Course.    Details: Ct ordered reviewed and interpreted,  Pt is aware of renal mass. ECG/medicine tests: ordered.  Risk Prescription drug management. Risk Details: Dr. Wyvonnia Dusky evaluated pt.   Differential diagnosis, possible kidney stone/shingles/back pain from degenerative disease.  Pt given rx for pain medication.  Pt advised to see his Md for recheck in 2 days.             Final Clinical Impression(s) / ED Diagnoses Final diagnoses:  Left flank pain    Rx / DC Orders ED Discharge Orders          Ordered    HYDROcodone-acetaminophen (NORCO/VICODIN) 5-325 MG tablet  Every 6 hours PRN        04/24/22 2222           An After Visit Summary was printed and given to the patient.    Fransico Meadow, Hershal Coria 04/25/22 2341    Ezequiel Essex, MD 04/28/22 1204

## 2022-04-24 NOTE — ED Triage Notes (Signed)
Patient here POV from Home.  Endorses Pain to Left Flank that began at approximately 0500. Worsens with Certain Movements.  No N/V/D. No Constipation. No Dysuria.   NAD Noted during Triage. A&O4. GCS 15.Ambulatory with Gilford Rile.

## 2022-04-24 NOTE — ED Notes (Signed)
RN provided AVS using Teachback Method. Patient verbalizes understanding of Discharge Instructions. Opportunity for Questioning and Answers were provided by RN. Patient Discharged from ED ambulatory with Johnny Morgan to Home with Friend.

## 2022-04-24 NOTE — Discharge Instructions (Addendum)
Watch for the appearance of a rash

## 2022-05-01 ENCOUNTER — Encounter: Payer: Self-pay | Admitting: *Deleted

## 2022-05-01 ENCOUNTER — Ambulatory Visit
Admission: RE | Admit: 2022-05-01 | Discharge: 2022-05-01 | Disposition: A | Discharge status: Home / Self Care | Payer: Medicare HMO | Source: Ambulatory Visit | Attending: Urology | Admitting: Urology

## 2022-05-01 DIAGNOSIS — D49512 Neoplasm of unspecified behavior of left kidney: Secondary | ICD-10-CM

## 2022-05-01 DIAGNOSIS — N2889 Other specified disorders of kidney and ureter: Secondary | ICD-10-CM | POA: Diagnosis not present

## 2022-05-01 HISTORY — PX: IR RADIOLOGIST EVAL & MGMT: IMG5224

## 2022-05-01 NOTE — Consult Note (Signed)
Chief Complaint: Left sided renal lesion  Referring Physician(s): Winter,Christopher Marjory Lies  History of Present Illness: Johnny Morgan is a 81 y.o. male with past medical history significant for CAD, myocardial infarction, ischemic cardiomyopathy, paroxysmal atrial fibrillation, not currently on anticoagulation, hypertension, lupus and nephrolithiasis who has been referred by Dr. Lovena Neighbours for evaluation of left-sided renal lesion incidentally noted on abdominal CT performed 03/07/2022 performed for evaluation of back pain, constipation and left-sided flank pain.  The lesion was further evaluated on abdominal MRI performed 04/06/2022 which confirmed the presence of an approximately 1.6 cm enhancing lesion within the inferior pole of the left kidney with imaging characteristics compatible with a renal cell carcinoma.  Given the patient's advanced age and multiple medical comorbidities, he is considered a poor operative candidate and has been referred for evaluation of percutaneous management.  Unfortunately, the patient states that his wife passed away last week after a prolonged battle with dementia.  As such, the patient is planning on leaving the area and moving to [place] to be closer to his family.  Past Medical History:  Diagnosis Date   CAD (coronary artery disease)    s/p NSTEMI in 3/11 >> LHC with high grade LM disease, EF 35-40 >> s/p CABG at [facility] in [place] (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   had a thoracotomy    Nephrolithiasis    PAF (paroxysmal atrial fibrillation) ([place])    notes from [place] indicate prior Holter showed AF // patient was on Amiodarone + Apixaban for 2-3 years // patient stopped Amio due to rash in 2017 // pt stopped anticoag due to cost (Eliquis and Xarelto) // Holter in 11/15 (on Amio) neg for AFib    SLE (systemic lupus erythematosus) ([place])    prior hx of long term prednisone use   SOB (shortness of breath)     Past Surgical History:  Procedure Laterality Date   CATARACT EXTRACTION     COLONOSCOPY  2017   CORONARY ARTERY BYPASS GRAFT  2010   INGUINAL HERNIA REPAIR Bilateral 1992   Partial Shoulder Replacement Right 2008   THORACOTOMY  1989   Dx with [facility]   TOTAL HIP ARTHROPLASTY Right 1999    Allergies: Penicillins, Sulfa antibiotics, and Tamiflu [oseltamivir phosphate]  Medications: Prior to Admission medications   Medication Sig Start Date End Date Taking? Authorizing Provider  aspirin EC 81 MG tablet Take 81 mg by mouth daily.    [provider]  atorvastatin (LIPITOR) 20 MG tablet Take 1 tablet (20 mg total) by mouth daily. 06/03/21 06/03/22  Richardson Dopp T, PA-C  HYDROcodone-acetaminophen (NORCO/VICODIN) 5-325 MG tablet Take 1 tablet by mouth every 6 (six) hours as needed for moderate pain. 04/24/22 04/24/23  Fransico Meadow, PA-C  irbesartan (AVAPRO) 75 MG tablet Take 75 mg by mouth daily.    [provider]  METOPROLOL SUCCINATE ER PO Take 25 mg by mouth daily.    [provider]  omeprazole (PRILOSEC) 20 MG capsule Take 20 mg by mouth daily.    [provider]  tamsulosin (FLOMAX) 0.4 MG CAPS capsule Take 0.4 mg by mouth daily. 05/22/20   [provider]     Family History  Problem Relation Age of Onset   Heart disease Sister    Heart disease Brother    Heart disease Sister     Social History   Socioeconomic History   Marital status: Divorced    Spouse name: Not on file   Number of children: Not on file    Years of education: Not on file   Highest education level: Not on file  Occupational History   Occupation: Retired    Comment: Prior Games developer  Tobacco Use   Smoking status: Former    Types: Cigarettes    Quit date: 1990    Years since quitting: 33.6   Smokeless tobacco: Never  Substance and Sexual Activity   Alcohol use: No   Drug use: No   Sexual activity: Not on file  Other Topics Concern   Not on file  Social History Narrative   Moved to [place] from [place] area in 05/2016   Divorced   3 children   Social Determinants of Health   Financial Resource Strain: Not on file  Food Insecurity: Not on file  Transportation Needs: Not on file  Physical Activity: Not on file  Stress: Not on file  Social Connections: Not on file    ECOG Status: 1 - Symptomatic but completely ambulatory  Review of Systems  Review of Systems: A 12 point ROS discussed and pertinent positives are indicated in the HPI above.  All other systems are negative.   Physical Exam No direct physical exam was performed (except for noted visual exam findings with Video Visits).   Vital Signs: There were no vitals taken for this visit.  Imaging:  The following examinations were personally reviewed in detail:  CT abdomen pelvis-03/07/2022; 04/24/2022 Abdominal MRI-04/05/2022.  Abdominal MRI demonstrates an approximately 1.7 x 1.6 x 1.3 cm partially exophytic lesion arising from the posterior inferior pole of the left kidney (axial image 78, series 15; from image 31, series 17).  The left renal vein appears widely patent.  No regional lymphadenopathy.  Normal appearance of the bilateral adrenal glands the right kidney is atrophic in comparison to the left.  Review of CT scan performed 03/07/2022 demonstrates bilateral nephrolithiasis without evidence of urinary obstruction.    CT Angio Chest/Abd/Pel for Dissection W and/or Wo Contrast  Result Date: 04/24/2022 CLINICAL DATA:  Chest pain or back pain, aortic  dissection suspected. Left flank pain. EXAM: CT ANGIOGRAPHY CHEST, ABDOMEN AND PELVIS TECHNIQUE: Non-contrast CT of the chest was initially obtained. Multidetector CT imaging through the chest, abdomen and pelvis was performed using the standard protocol during bolus administration of intravenous contrast. Multiplanar reconstructed images and MIPs were obtained and reviewed to evaluate the vascular anatomy. RADIATION DOSE REDUCTION: This exam was performed according to the departmental dose-optimization program which includes automated exposure control, adjustment of the mA and/or kV according to patient size and/or use of iterative reconstruction technique. 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 This is stable. No aortic dissection. Diffuse aortic atherosclerosis. Moderate coronary artery calcifications. Heart is normal size. Mediastinum/Nodes: Numerous calcified mediastinal and bilateral hilar lymph nodes, stable compatible with old granulomatous disease. Trachea  and esophagus are unremarkable. Thyroid unremarkable. Lungs/Pleura: Centrilobular and paraseptal emphysema. Extensive calcified granulomas throughout the lungs. Airspace opacity in the right lower lobe. No effusions. Musculoskeletal: Chest wall soft tissues are unremarkable. No acute bony abnormality. Review of the MIP images confirms the above findings. CTA ABDOMEN AND PELVIS FINDINGS VASCULAR Aorta: Diffuse aortic atherosclerosis. Tortuous aorta. No evidence of aneurysm or dissection. Celiac: Patent. 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 Widely patent left renal artery. IMA: Patent Inflow: Advanced atherosclerosis and tortuosity. Mild aneurysmal dilatation of the right common iliac artery measuring 2 cm and the left  common iliac artery measuring 1.7 cm. No dissection. Veins: No obvious venous abnormality within the limitations of this arterial phase study. Review of the MIP images confirms the above findings. NON-VASCULAR Hepatobiliary: No focal liver abnormality is seen. Status post cholecystectomy. No biliary dilatation. Pancreas: No focal abnormality or ductal dilatation. Spleen: No focal abnormality.  Normal size. 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 No hydronephrosis. Concern for small enhancing lesion in the lower pole of the left kidney measuring 11 mm. Small cysts elsewhere in the left kidney. Adrenal glands and urinary bladder unremarkable. Stomach/Bowel: Colonic diverticulosis. No active diverticulitis. Stomach and small bowel decompressed, unremarkable. Lymphatic: No adenopathy Reproductive: Prostate enlargement Other: No free fluid or free air. Musculoskeletal: Prior right hip replacement. No acute bony abnormality. Review of the MIP images confirms the above findings. IMPRESSION: No evidence of aortic dissection. 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 Recommend annual imaging followup by CTA or MRA. This recommendation follows 2010 ACCF/AHA/AATS/ACR/ASA/SCA/SCAI/SIR/STS/SVM Guidelines for the Diagnosis and Management of Patients with Thoracic Aortic Disease. Circulation. 2010; 121: E366-Q947. 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 Concern for small enhancing lesion in the lower pole of the left kidney measuring up to 11 mm. Recommend further evaluation with nonemergent outpatient MRI when patient is able to hold breath. Emphysema. Old granulomatous disease. Colonic diverticulosis. Electronically Signed   By: Rolm Baptise M.D.   On: 04/24/2022 19:14   MR ABDOMEN WWO CONTRAST  Result Date:  04/06/2022 CLINICAL DATA:  Renal mass evaluation EXAM: MRI ABDOMEN WITHOUT AND WITH CONTRAST TECHNIQUE: Multiplanar multisequence MR imaging of the abdomen was performed both before and after the administration of intravenous contrast. CONTRAST:  14m MULTIHANCE GADOBENATE DIMEGLUMINE 529 MG/ML IV SOLN COMPARISON:  CT abdomen and pelvis 03/07/2022 FINDINGS: Study is limited due to motion. Lower chest: No acute findings. Hepatobiliary: Liver is normal in size and contour with no suspicious mass identified. Gallbladder appears grossly normal. No biliary ductal dilatation. Pancreas: No mass, inflammatory changes, or other parenchymal abnormality identified. Spleen:  Within normal limits in size and appearance. Adrenals/Urinary Tract: Adrenal glands appear normal. Right renal atrophy, especially in the upper pole. Numerous hyperintense T2, hypointense T1 signal renal cysts identified bilaterally measuring up to 1.9 cm in the lateral left kidney. 1.6 cm partially exophytic mass in the lower pole left kidney is mildly hypointense T2, isointense T1 and demonstrates postcontrast enhancement. No hydronephrosis identified. Stomach/Bowel: Colonic diverticulosis. Vascular/Lymphatic: No pathologically enlarged lymph nodes identified. No abdominal aortic aneurysm demonstrated. Renal veins appear within normal limits. Other:  No ascites. Musculoskeletal: No suspicious bone lesions identified. IMPRESSION: 1. 1.6 cm enhancing mass in the lower pole left kidney which should be considered renal cell carcinoma until proven otherwise. 2.  No lymphadenopathy or renal vein involvement. 3. Numerous small renal cysts.  Right renal atrophy. 4. Colonic diverticulosis. Electronically Signed   By: Ofilia Neas M.D.   On: 04/06/2022 13:52    Labs:  CBC: Recent Labs    05/31/21 0932 03/25/22 1149 04/24/22 1637  WBC 6.3 7.1 8.2  HGB 14.9 14.7 14.4  HCT 43.0 44.8 43.3  PLT 177 171 169    COAGS: No results for input(s): "INR",  "APTT" in the last 8760 hours.  BMP: Recent Labs    05/31/21 0932 03/25/22 1149 04/24/22 1637  NA 141 143 142  K 4.1 4.3 4.3  CL 106 113* 108  CO2 21 21* 22  GLUCOSE 79 89 90  BUN 19 19 26*  CALCIUM 9.2 9.4 9.5  CREATININE 1.33* 1.22 1.28*  GFRNONAA  --  59* 56*    LIVER FUNCTION TESTS: Recent Labs    05/31/21 0932  BILITOT 0.8  AST 20  ALT 10  ALKPHOS 95  PROT 7.5  ALBUMIN 4.1    TUMOR MARKERS: No results for input(s): "AFPTM", "CEA", "CA199", "CHROMGRNA" in the last 8760 hours.  Assessment and Plan:  Johnny Morgan is a 81 y.o. male with past medical history significant for CAD, myocardial infarction, ischemic cardiomyopathy, paroxysmal atrial fibrillation, not currently on anticoagulation, hypertension, lupus and nephrolithiasis who has been referred by Dr. Lovena Neighbours for evaluation of left-sided renal lesion incidentally noted on abdominal CT performed 03/07/2022 performed for evaluation of back pain, constipation and left-sided flank pain.  The lesion was further evaluated on abdominal MRI performed 04/06/2022 which confirmed the presence of an approximately 1.6 cm enhancing lesion within the inferior pole of the left kidney with imaging characteristics compatible with a renal cell carcinoma.  Given the patient's advanced age and multiple medical comorbidities, he is considered a poor operative candidate and has been referred for evaluation of percutaneous management.  The following examinations were personally reviewed in detail:  CT abdomen pelvis-03/07/2022; 04/24/2022 Abdominal MRI-04/05/2022.  Abdominal MRI demonstrates an approximately 1.7 x 1.6 x 1.3 cm partially exophytic lesion arising from the posterior inferior pole of the left kidney (axial image 78, series 15; from image 31, series 17).  The left renal vein appears widely patent.  No regional lymphadenopathy.  Normal appearance of the bilateral adrenal glands the right kidney is atrophic in comparison to the left.   Review of CT scan performed 03/07/2022 demonstrates bilateral nephrolithiasis without evidence of urinary obstruction.    I explained to the patient that as the lesion demonstrates imaging characteristics compatible with a renal cell carcinoma, biopsy is not warranted.  Potential treatment options were discussed with the patient as follows:  1.  Conservative management - I explained to the patient that these incidentally discovered left-sided renal lesions typically demonstrate indolent behavior with slow growth late, typically at a rate of 1 to 2 mm/year.  The left sided renal lesion presently measures 1.7 cm in maximal diameter however we have no more remote prior examination to confirm it's growth pattern.  If patient were to consider conservative management, I would obtain a follow-up MRI in 3 months (January 2024.  2.  Surgical resection - I explained that this is considered the gold standard as surgical margins are obtained at the time of the presumed partial nephrectomy.  Given patient's advanced age, multiple medical comorbidities as well as asymmetric atrophy of the contralateral right kidney, the patient has been deemed a poor operative candidate.  3.  Image guided cryoablation - I  explained that given the size and location of the solitary left-sided renal lesion, the patient may be a candidate for image guided ablation.  I explained that cryoablation is performed with curative intent however as surgical margins are not obtained at the time of procedure, the patient will be followed with serial imaging typically for the following 3 years following the procedure.  While the procedure is performed in a nephron sparing manner, potential complications include worsening renal function, bleeding, postprocedural flank pain and/or thigh pain (due to irritation of the left anterior femoral cutaneous nerve), infection and/or injury to adjacent structure/organ.  Following above detailed conversation, the  patient wishes to pursue conservative management at this time however as he plans on relocating to [place] within the next month, would defer subsequent management and surveillance to the team he establishes care with in his new home.  PLAN: - No procedure or dedicated follow-up as the patient is planning on relocating to [place] after the passing of his wife last week. - If patient decides to stay in the [facility] area, would recommend surveillance abdominal MRI in 6 months (January 2024), however it is the patient's responsibility to notify the IR clinic if he stays in the [place] area.  Thank you for this interesting consult.  I greatly enjoyed meeting Issa Kosmicki and look forward to participating in their care.  A copy of this report was sent to the requesting provider on this date.  Electronically Signed: Sandi Mariscal 05/01/2022, 8:41 AM   I spent a total of 30 Minutes in remote  clinical consultation, greater than 50% of which was counseling/coordinating care for left sided renal lesion.    Visit type: Audio only (telephone). Audio (no video) only due to patient's lack of internet/smartphone capability. Alternative for in-person consultation at [facility], [address], [place], [place]. This visit type was conducted due to national recommendations for restrictions regarding the COVID-19 Pandemic (e.g. social distancing).  This format is felt to be most appropriate for this patient at this time.  All issues noted in this document were discussed and addressed.

## 2022-05-15 DIAGNOSIS — I10 Essential (primary) hypertension: Secondary | ICD-10-CM | POA: Diagnosis not present

## 2022-05-15 DIAGNOSIS — Z87448 Personal history of other diseases of urinary system: Secondary | ICD-10-CM | POA: Diagnosis not present

## 2022-05-24 DIAGNOSIS — Z951 Presence of aortocoronary bypass graft: Secondary | ICD-10-CM | POA: Diagnosis not present

## 2022-05-24 DIAGNOSIS — Z87448 Personal history of other diseases of urinary system: Secondary | ICD-10-CM | POA: Diagnosis not present

## 2022-05-24 DIAGNOSIS — I2510 Atherosclerotic heart disease of native coronary artery without angina pectoris: Secondary | ICD-10-CM | POA: Diagnosis not present

## 2022-05-24 DIAGNOSIS — I714 Abdominal aortic aneurysm, without rupture, unspecified: Secondary | ICD-10-CM | POA: Diagnosis not present

## 2022-05-24 DIAGNOSIS — M329 Systemic lupus erythematosus, unspecified: Secondary | ICD-10-CM | POA: Diagnosis not present

## 2022-05-24 DIAGNOSIS — K219 Gastro-esophageal reflux disease without esophagitis: Secondary | ICD-10-CM | POA: Diagnosis not present

## 2022-05-24 DIAGNOSIS — E785 Hyperlipidemia, unspecified: Secondary | ICD-10-CM | POA: Diagnosis not present

## 2022-05-24 DIAGNOSIS — I10 Essential (primary) hypertension: Secondary | ICD-10-CM | POA: Diagnosis not present

## 2022-05-24 DIAGNOSIS — Z9181 History of falling: Secondary | ICD-10-CM | POA: Diagnosis not present

## 2022-05-26 DIAGNOSIS — I10 Essential (primary) hypertension: Secondary | ICD-10-CM | POA: Diagnosis not present

## 2022-05-26 DIAGNOSIS — Z951 Presence of aortocoronary bypass graft: Secondary | ICD-10-CM | POA: Diagnosis not present

## 2022-05-26 DIAGNOSIS — E785 Hyperlipidemia, unspecified: Secondary | ICD-10-CM | POA: Diagnosis not present

## 2022-05-26 DIAGNOSIS — I2510 Atherosclerotic heart disease of native coronary artery without angina pectoris: Secondary | ICD-10-CM | POA: Diagnosis not present

## 2022-05-26 DIAGNOSIS — M329 Systemic lupus erythematosus, unspecified: Secondary | ICD-10-CM | POA: Diagnosis not present

## 2022-05-31 DIAGNOSIS — I714 Abdominal aortic aneurysm, without rupture, unspecified: Secondary | ICD-10-CM | POA: Diagnosis not present

## 2022-05-31 DIAGNOSIS — Z87442 Personal history of urinary calculi: Secondary | ICD-10-CM | POA: Diagnosis not present

## 2022-05-31 DIAGNOSIS — I10 Essential (primary) hypertension: Secondary | ICD-10-CM | POA: Diagnosis not present

## 2022-05-31 DIAGNOSIS — N1832 Chronic kidney disease, stage 3b: Secondary | ICD-10-CM | POA: Diagnosis not present

## 2022-05-31 DIAGNOSIS — I2510 Atherosclerotic heart disease of native coronary artery without angina pectoris: Secondary | ICD-10-CM | POA: Diagnosis not present

## 2022-05-31 DIAGNOSIS — E782 Mixed hyperlipidemia: Secondary | ICD-10-CM | POA: Diagnosis not present

## 2022-06-02 DIAGNOSIS — N1832 Chronic kidney disease, stage 3b: Secondary | ICD-10-CM | POA: Diagnosis not present

## 2022-06-08 DIAGNOSIS — M87059 Idiopathic aseptic necrosis of unspecified femur: Secondary | ICD-10-CM | POA: Diagnosis not present

## 2022-06-08 DIAGNOSIS — N2889 Other specified disorders of kidney and ureter: Secondary | ICD-10-CM | POA: Diagnosis not present

## 2022-06-08 DIAGNOSIS — N289 Disorder of kidney and ureter, unspecified: Secondary | ICD-10-CM | POA: Diagnosis not present

## 2022-06-08 DIAGNOSIS — I2510 Atherosclerotic heart disease of native coronary artery without angina pectoris: Secondary | ICD-10-CM | POA: Diagnosis not present

## 2022-06-08 DIAGNOSIS — N200 Calculus of kidney: Secondary | ICD-10-CM | POA: Diagnosis not present

## 2022-06-08 DIAGNOSIS — I77810 Thoracic aortic ectasia: Secondary | ICD-10-CM | POA: Diagnosis not present

## 2022-06-08 DIAGNOSIS — R7989 Other specified abnormal findings of blood chemistry: Secondary | ICD-10-CM | POA: Diagnosis not present

## 2022-06-08 DIAGNOSIS — M25559 Pain in unspecified hip: Secondary | ICD-10-CM | POA: Diagnosis not present

## 2022-06-08 DIAGNOSIS — N261 Atrophy of kidney (terminal): Secondary | ICD-10-CM | POA: Diagnosis not present

## 2022-06-08 DIAGNOSIS — N183 Chronic kidney disease, stage 3 unspecified: Secondary | ICD-10-CM | POA: Diagnosis not present

## 2022-06-08 DIAGNOSIS — J309 Allergic rhinitis, unspecified: Secondary | ICD-10-CM | POA: Diagnosis not present

## 2022-06-08 DIAGNOSIS — Z0130 Encounter for examination of blood pressure without abnormal findings: Secondary | ICD-10-CM | POA: Diagnosis not present

## 2022-06-12 ENCOUNTER — Telehealth: Payer: Self-pay | Admitting: Interventional Cardiology

## 2022-06-12 NOTE — Telephone Encounter (Signed)
Patient states he has moved to [place] and has a new doctor.

## 2022-06-17 ENCOUNTER — Other Ambulatory Visit: Payer: Self-pay | Admitting: Physician Assistant

## 2022-06-21 DIAGNOSIS — I10 Essential (primary) hypertension: Secondary | ICD-10-CM | POA: Diagnosis not present

## 2022-06-21 DIAGNOSIS — I77810 Thoracic aortic ectasia: Secondary | ICD-10-CM | POA: Diagnosis not present

## 2022-06-21 DIAGNOSIS — N289 Disorder of kidney and ureter, unspecified: Secondary | ICD-10-CM | POA: Diagnosis not present

## 2022-06-21 DIAGNOSIS — J309 Allergic rhinitis, unspecified: Secondary | ICD-10-CM | POA: Diagnosis not present

## 2022-06-22 DIAGNOSIS — R7989 Other specified abnormal findings of blood chemistry: Secondary | ICD-10-CM | POA: Diagnosis not present

## 2022-06-22 DIAGNOSIS — N2889 Other specified disorders of kidney and ureter: Secondary | ICD-10-CM | POA: Diagnosis not present

## 2022-06-29 DIAGNOSIS — N200 Calculus of kidney: Secondary | ICD-10-CM | POA: Diagnosis not present

## 2022-06-29 DIAGNOSIS — N400 Enlarged prostate without lower urinary tract symptoms: Secondary | ICD-10-CM | POA: Diagnosis not present

## 2022-06-29 DIAGNOSIS — N2889 Other specified disorders of kidney and ureter: Secondary | ICD-10-CM | POA: Diagnosis not present

## 2022-07-04 DIAGNOSIS — N250 Renal osteodystrophy: Secondary | ICD-10-CM | POA: Diagnosis not present

## 2022-07-04 DIAGNOSIS — Z87442 Personal history of urinary calculi: Secondary | ICD-10-CM | POA: Diagnosis not present

## 2022-07-04 DIAGNOSIS — I714 Abdominal aortic aneurysm, without rupture, unspecified: Secondary | ICD-10-CM | POA: Diagnosis not present

## 2022-07-04 DIAGNOSIS — E782 Mixed hyperlipidemia: Secondary | ICD-10-CM | POA: Diagnosis not present

## 2022-07-04 DIAGNOSIS — I10 Essential (primary) hypertension: Secondary | ICD-10-CM | POA: Diagnosis not present

## 2022-07-04 DIAGNOSIS — I2510 Atherosclerotic heart disease of native coronary artery without angina pectoris: Secondary | ICD-10-CM | POA: Diagnosis not present

## 2022-07-04 DIAGNOSIS — N1832 Chronic kidney disease, stage 3b: Secondary | ICD-10-CM | POA: Diagnosis not present

## 2022-08-12 DIAGNOSIS — K5900 Constipation, unspecified: Secondary | ICD-10-CM | POA: Diagnosis not present

## 2022-08-12 DIAGNOSIS — N189 Chronic kidney disease, unspecified: Secondary | ICD-10-CM | POA: Diagnosis not present

## 2022-08-12 DIAGNOSIS — I129 Hypertensive chronic kidney disease with stage 1 through stage 4 chronic kidney disease, or unspecified chronic kidney disease: Secondary | ICD-10-CM | POA: Diagnosis not present

## 2022-08-12 DIAGNOSIS — R140 Abdominal distension (gaseous): Secondary | ICD-10-CM | POA: Diagnosis not present

## 2022-08-12 DIAGNOSIS — K648 Other hemorrhoids: Secondary | ICD-10-CM | POA: Diagnosis not present

## 2022-12-15 IMAGING — CT CT ABD-PELV W/O CM
1 of 2 series · 12 of 32 positions shown, 17 images · non-contrast
Comparison: None Available.

CLINICAL DATA: Back pain, constipation, left flank pain



[Series 2: renal standard/full · axial · 0.68mm/px · z∈[-708,-253]mm · 12 of 105 slices shown, 17 images]
[im 7/105  soft-tissue]
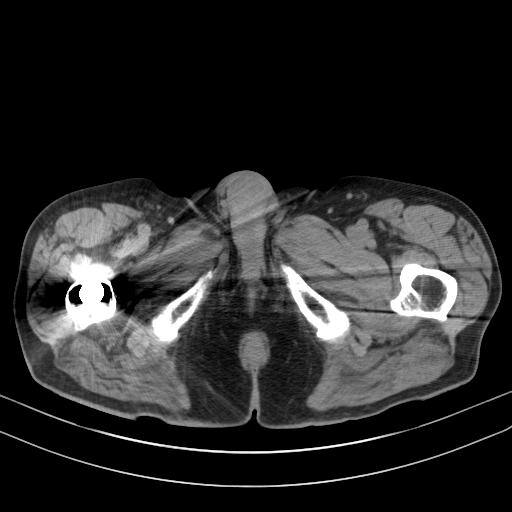
[im 7/105  bone]
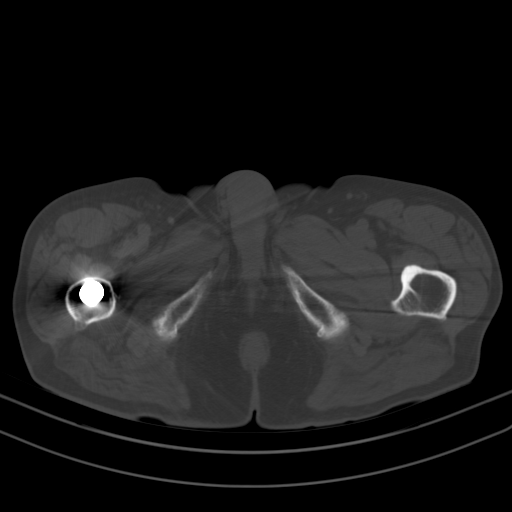
[im 20/105  soft-tissue]
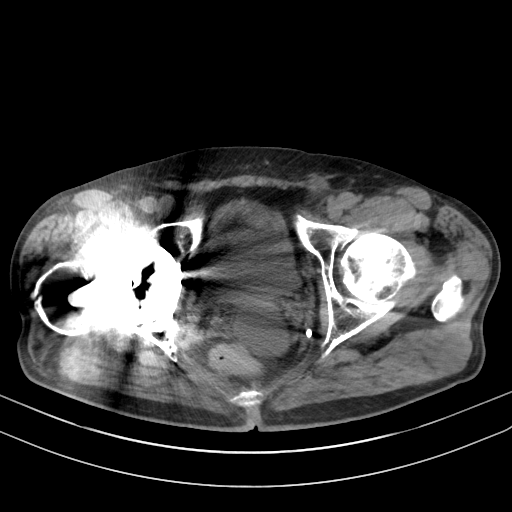
[im 27/105  soft-tissue]
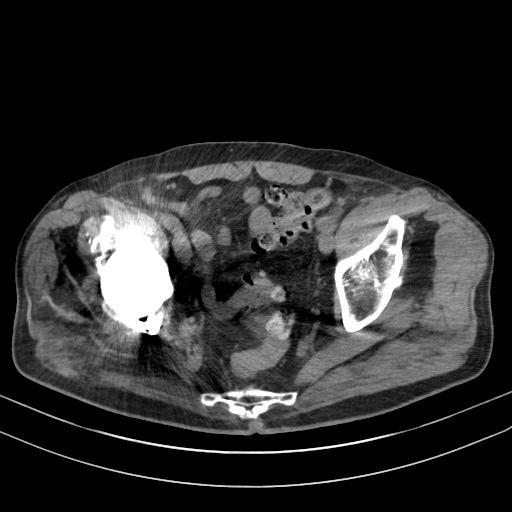
[im 33/105  soft-tissue]
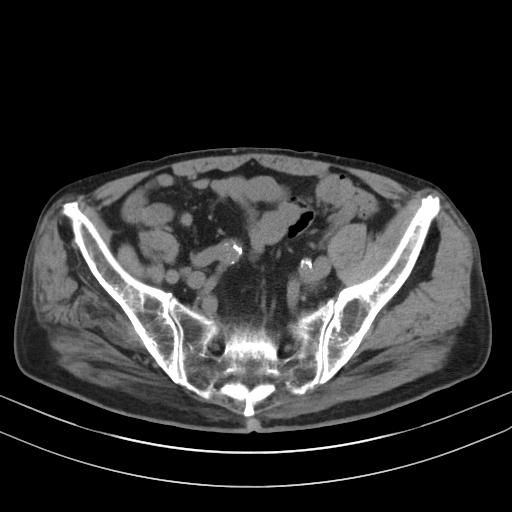
[im 46/105  soft-tissue]
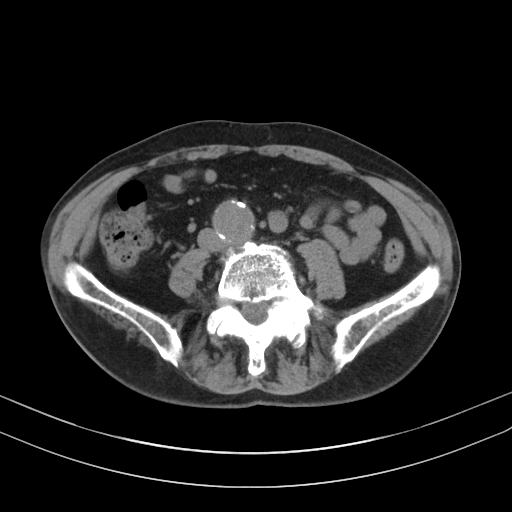
[im 53/105  soft-tissue]
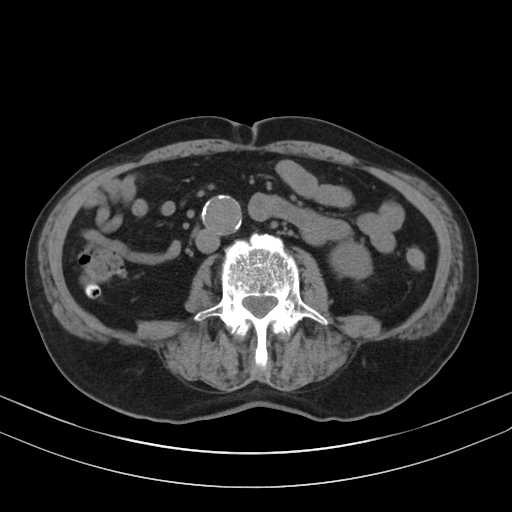
[im 59/105  soft-tissue]
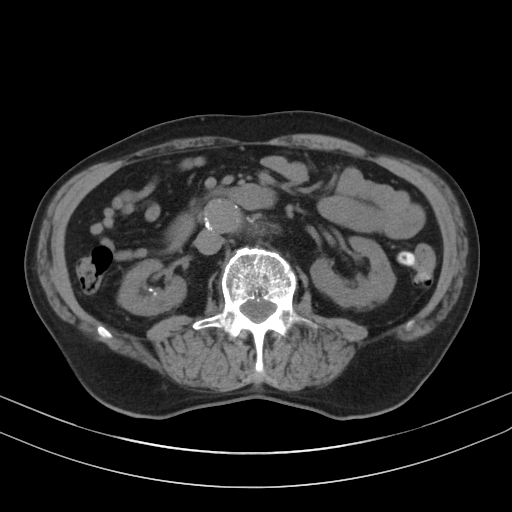
[im 72/105  soft-tissue]
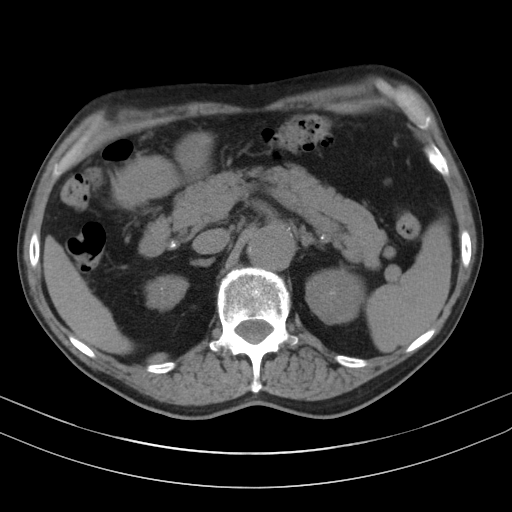
[im 79/105  soft-tissue]
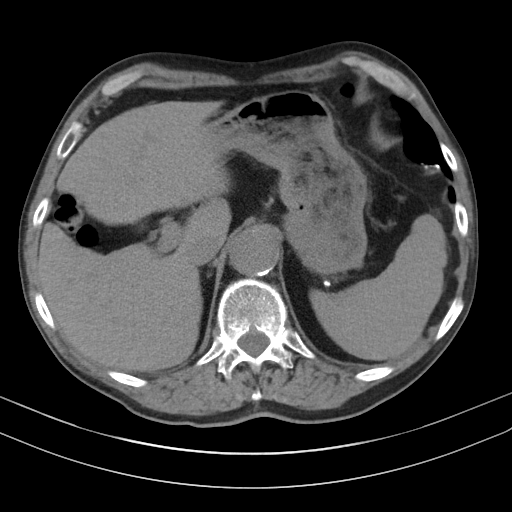
[im 79/105  lung]
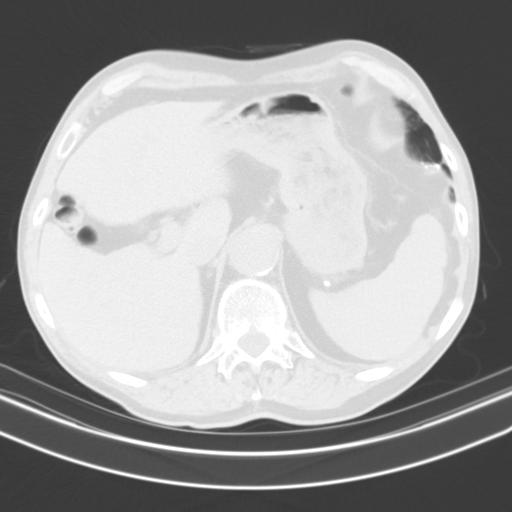
[im 79/105  bone]
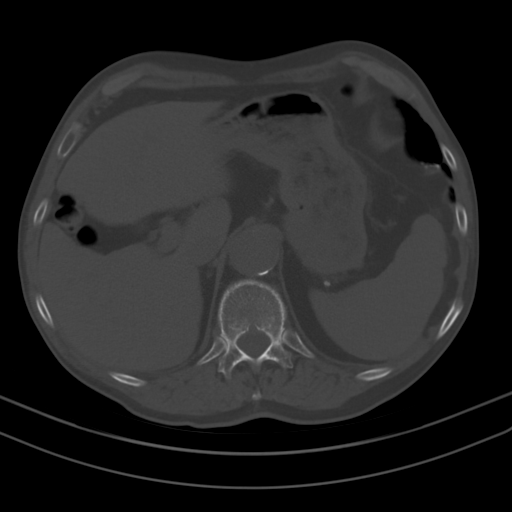
[im 85/105  soft-tissue]
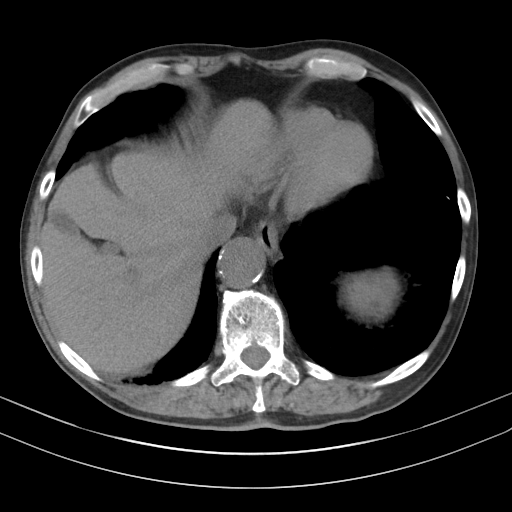
[im 85/105  lung]
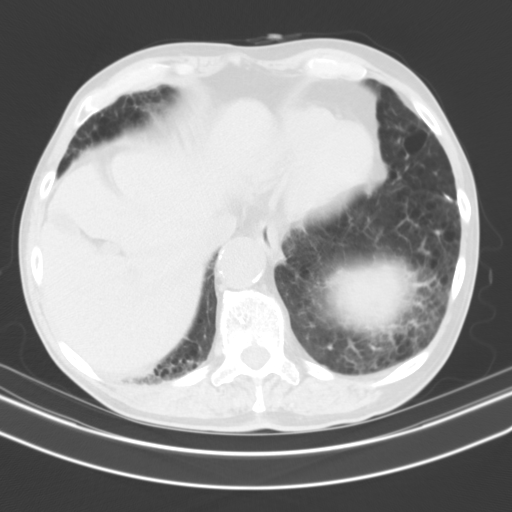
[im 92/105  lung]
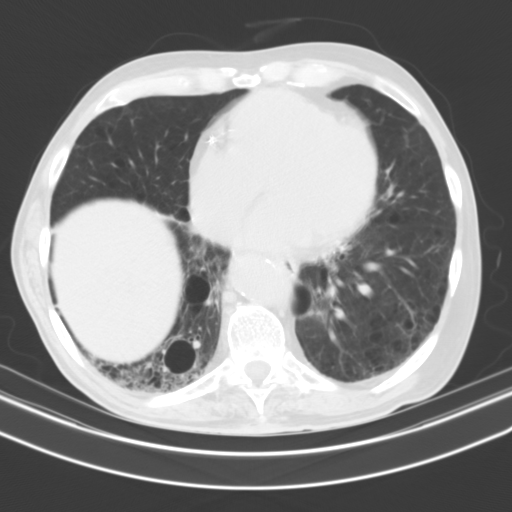
[im 98/105  soft-tissue]
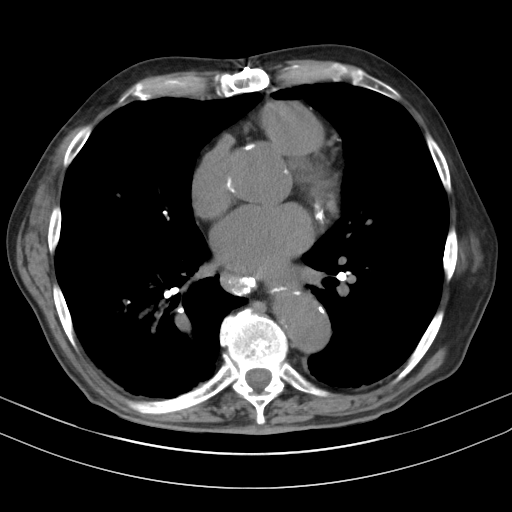
[im 98/105  lung]
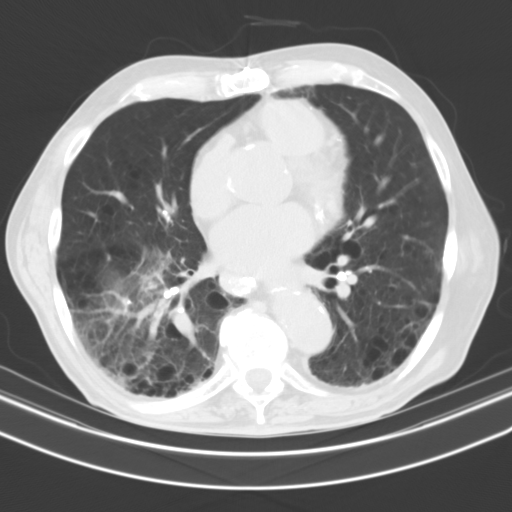

[12 of 32 positions shown; findings below may reference images not displayed]

FINDINGS: Lower chest: Bullous emphysema is seen in the lower lung fields.
Increased interstitial markings are seen in the right lower lobe
possibly suggesting scarring or superimposed interstitial pneumonia.
There is no pleural effusion. Coronary artery calcifications are
seen. There is ectasia of ascending thoracic aorta measuring 3.9 cm
suggesting pulmonary arterial hypertension. There is ectasia of
ascending thoracic aorta measuring 4.3 cm. There are densely
calcified lymph nodes in the mediastinum and both hilar regions.
There are scattered calcified granulomas in the lung fields.

Hepatobiliary: No focal abnormality is seen in the liver.
Gallbladder is not distended. There is no dilation of bile ducts.

Pancreas: No focal abnormality is seen.

Spleen: Unremarkable.

Adrenals/Urinary Tract: Adrenals are not enlarged. There is no
hydronephrosis. There are multiple bilateral renal stones. Largest
of the stones is in the upper pole of right kidney measuring 6 mm.
There are few low-density lesions in the left kidney largest
measuring 1.7 cm suggesting renal cysts. There is 1.3 cm exophytic
lesion in the posterior margin of lower pole of left kidney with
density measurements higher than usual for simple cyst. Ureters are
not dilated. Urinary bladder is not distended. Beam hardening
artifacts limit evaluation of urinary bladder.

Stomach/Bowel: Stomach is unremarkable. Small bowel loops are not
dilated. Appendix is not dilated. Multiple diverticula are seen in
the colon without signs of focal acute diverticulitis. There is
cm fluid density structure in the posterior aspect pelvic cavity
anterior to the rectum. Beam hardening artifacts limit evaluation of
this finding. Differential diagnostic possibilities would include
cystocele of the urinary bladder or duplication cyst.

Vascular/Lymphatic: There is ectasia and tortuosity in the abdominal
aorta and its major branches. Infrarenal aorta measures 3 cm in
diameter. There is ectasia of common iliac arteries. Right common
iliac artery measures 2.5 cm in diameter. Left common iliac artery
measures 1.9 cm in diameter.

Reproductive: Prostate is enlarged with coarse calcifications.

Other: There is no ascites or pneumoperitoneum.

Musculoskeletal: There is previous right hip arthroplasty. Avascular
necrosis is seen in the head of the left femur without demonstrable
compression fracture.
IMPRESSION: There is no evidence of intestinal obstruction or pneumoperitoneum.
There is no hydronephrosis. Diverticulosis of colon without signs of
focal diverticulitis.

Bilateral renal stones. Renal cysts. There is 1.3 cm exophytic
lesion in the lower pole of left kidney with density measurements
higher than usual for simple cyst. This may suggest a hyperdense
cyst or solid lesion. Follow-up renal sonogram or multiphasic CT may
be considered.

There is 4.5 cm fluid density structure in the posterior aspect of
pelvis. Evaluation of this finding is limited by severe beam
hardening artifacts. The lesion may suggest a diverticulum of the
urinary bladder or cystocele of the urinary bladder or enteric
duplication cyst. Follow-up CT with contrast may be considered.

Coronary artery disease. Severe COPD. Increased interstitial
markings in the right lower lung fields may suggest scarring or
superimposed interstitial pneumonia. There is ectasia of main
pulmonary artery suggesting pulmonary arterial hypertension.

There is ectasia of ascending thoracic aorta measuring 4.2 cm. There
is tortuosity and ectasia in the abdominal aorta. Infrarenal aorta
measures 3 cm in diameter. Avascular necrosis is seen in the head of
the left femur.

Other findings as described in the body of the report.

## 2023-03-21 ENCOUNTER — Other Ambulatory Visit: Payer: Self-pay
